# Patient Record
Sex: Female | Born: 1975 | Race: White | Hispanic: No | Marital: Married | Smoking: Never smoker
Health system: Southern US, Community
[De-identification: ages and names within clinical notes are randomized; demographics above are authoritative.]

## PROBLEM LIST (undated history)

## (undated) DIAGNOSIS — E669 Obesity, unspecified: Secondary | ICD-10-CM

## (undated) DIAGNOSIS — M1990 Unspecified osteoarthritis, unspecified site: Secondary | ICD-10-CM

## (undated) DIAGNOSIS — K219 Gastro-esophageal reflux disease without esophagitis: Secondary | ICD-10-CM

## (undated) HISTORY — DX: Gastro-esophageal reflux disease without esophagitis: K21.9

## (undated) HISTORY — DX: Obesity, unspecified: E66.9

## (undated) HISTORY — PX: CHOLECYSTECTOMY: SHX55

---

## 1998-02-10 ENCOUNTER — Inpatient Hospital Stay (HOSPITAL_COMMUNITY): Admission: AD | Admit: 1998-02-10 | Discharge: 1998-02-10 | Payer: Self-pay | Admitting: Obstetrics and Gynecology

## 1998-03-12 ENCOUNTER — Inpatient Hospital Stay (HOSPITAL_COMMUNITY): Admission: AD | Admit: 1998-03-12 | Discharge: 1998-03-12 | Payer: Self-pay | Admitting: *Deleted

## 1998-03-16 ENCOUNTER — Inpatient Hospital Stay (HOSPITAL_COMMUNITY): Admission: AD | Admit: 1998-03-16 | Discharge: 1998-03-19 | Payer: Self-pay | Admitting: Obstetrics and Gynecology

## 1998-03-19 ENCOUNTER — Encounter (HOSPITAL_COMMUNITY): Admission: RE | Admit: 1998-03-19 | Discharge: 1998-06-17 | Payer: Self-pay | Admitting: Obstetrics and Gynecology

## 1998-04-28 ENCOUNTER — Other Ambulatory Visit: Admission: RE | Admit: 1998-04-28 | Discharge: 1998-04-28 | Payer: Self-pay | Admitting: Obstetrics & Gynecology

## 1998-05-17 ENCOUNTER — Other Ambulatory Visit: Admission: RE | Admit: 1998-05-17 | Discharge: 1998-05-17 | Payer: Self-pay | Admitting: Obstetrics & Gynecology

## 1998-07-14 ENCOUNTER — Other Ambulatory Visit: Admission: RE | Admit: 1998-07-14 | Discharge: 1998-07-14 | Payer: Self-pay | Admitting: Obstetrics & Gynecology

## 1998-10-18 ENCOUNTER — Other Ambulatory Visit: Admission: RE | Admit: 1998-10-18 | Discharge: 1998-10-18 | Payer: Self-pay | Admitting: Obstetrics & Gynecology

## 2000-08-21 ENCOUNTER — Other Ambulatory Visit: Admission: RE | Admit: 2000-08-21 | Discharge: 2000-08-21 | Payer: Self-pay | Admitting: Obstetrics and Gynecology

## 2000-10-10 ENCOUNTER — Encounter: Payer: Self-pay | Admitting: Obstetrics and Gynecology

## 2000-10-10 ENCOUNTER — Ambulatory Visit (HOSPITAL_COMMUNITY): Admission: RE | Admit: 2000-10-10 | Discharge: 2000-10-10 | Payer: Self-pay | Admitting: Obstetrics and Gynecology

## 2000-10-12 ENCOUNTER — Inpatient Hospital Stay (HOSPITAL_COMMUNITY): Admission: AD | Admit: 2000-10-12 | Discharge: 2000-10-12 | Payer: Self-pay | Admitting: Obstetrics and Gynecology

## 2000-10-29 ENCOUNTER — Ambulatory Visit (HOSPITAL_COMMUNITY): Admission: RE | Admit: 2000-10-29 | Discharge: 2000-10-29 | Payer: Self-pay | Admitting: Obstetrics and Gynecology

## 2000-10-29 ENCOUNTER — Encounter: Payer: Self-pay | Admitting: Obstetrics and Gynecology

## 2000-11-07 ENCOUNTER — Encounter: Payer: Self-pay | Admitting: Obstetrics and Gynecology

## 2000-11-07 ENCOUNTER — Ambulatory Visit (HOSPITAL_COMMUNITY): Admission: RE | Admit: 2000-11-07 | Discharge: 2000-11-07 | Payer: Self-pay | Admitting: Obstetrics and Gynecology

## 2000-12-10 ENCOUNTER — Ambulatory Visit (HOSPITAL_COMMUNITY): Admission: RE | Admit: 2000-12-10 | Discharge: 2000-12-10 | Payer: Self-pay | Admitting: Obstetrics and Gynecology

## 2000-12-14 ENCOUNTER — Ambulatory Visit (HOSPITAL_COMMUNITY): Admission: RE | Admit: 2000-12-14 | Discharge: 2000-12-14 | Payer: Self-pay | Admitting: Obstetrics and Gynecology

## 2001-01-01 ENCOUNTER — Encounter: Payer: Self-pay | Admitting: Obstetrics and Gynecology

## 2001-01-01 ENCOUNTER — Encounter: Admission: RE | Admit: 2001-01-01 | Discharge: 2001-04-01 | Payer: Self-pay | Admitting: Obstetrics and Gynecology

## 2001-01-01 ENCOUNTER — Ambulatory Visit (HOSPITAL_COMMUNITY): Admission: RE | Admit: 2001-01-01 | Discharge: 2001-01-01 | Payer: Self-pay | Admitting: Obstetrics and Gynecology

## 2001-01-21 ENCOUNTER — Inpatient Hospital Stay (HOSPITAL_COMMUNITY): Admission: AD | Admit: 2001-01-21 | Discharge: 2001-01-21 | Payer: Self-pay | Admitting: Obstetrics and Gynecology

## 2001-02-08 ENCOUNTER — Encounter: Payer: Self-pay | Admitting: Obstetrics and Gynecology

## 2001-02-08 ENCOUNTER — Ambulatory Visit (HOSPITAL_COMMUNITY): Admission: RE | Admit: 2001-02-08 | Discharge: 2001-02-08 | Payer: Self-pay | Admitting: Obstetrics and Gynecology

## 2001-02-13 ENCOUNTER — Inpatient Hospital Stay (HOSPITAL_COMMUNITY): Admission: AD | Admit: 2001-02-13 | Discharge: 2001-02-13 | Payer: Self-pay | Admitting: Obstetrics and Gynecology

## 2001-02-19 ENCOUNTER — Encounter (HOSPITAL_COMMUNITY): Admission: RE | Admit: 2001-02-19 | Discharge: 2001-02-23 | Payer: Self-pay | Admitting: Obstetrics and Gynecology

## 2001-02-23 ENCOUNTER — Inpatient Hospital Stay (HOSPITAL_COMMUNITY): Admission: AD | Admit: 2001-02-23 | Discharge: 2001-02-26 | Payer: Self-pay | Admitting: Obstetrics and Gynecology

## 2001-10-23 ENCOUNTER — Emergency Department (HOSPITAL_COMMUNITY): Admission: EM | Admit: 2001-10-23 | Discharge: 2001-10-23 | Payer: Self-pay | Admitting: *Deleted

## 2005-04-02 ENCOUNTER — Emergency Department (HOSPITAL_COMMUNITY): Admission: EM | Admit: 2005-04-02 | Discharge: 2005-04-02 | Payer: Self-pay | Admitting: Emergency Medicine

## 2005-04-03 ENCOUNTER — Ambulatory Visit (HOSPITAL_COMMUNITY): Admission: RE | Admit: 2005-04-03 | Discharge: 2005-04-03 | Payer: Self-pay | Admitting: Emergency Medicine

## 2005-04-05 ENCOUNTER — Ambulatory Visit (HOSPITAL_COMMUNITY): Admission: RE | Admit: 2005-04-05 | Discharge: 2005-04-05 | Payer: Self-pay | Admitting: General Surgery

## 2008-12-10 ENCOUNTER — Ambulatory Visit: Payer: Self-pay | Admitting: Internal Medicine

## 2009-01-06 ENCOUNTER — Ambulatory Visit (HOSPITAL_COMMUNITY): Admission: RE | Admit: 2009-01-06 | Discharge: 2009-01-06 | Payer: Self-pay | Admitting: Internal Medicine

## 2009-01-06 ENCOUNTER — Ambulatory Visit: Payer: Self-pay | Admitting: Internal Medicine

## 2010-07-15 ENCOUNTER — Emergency Department (HOSPITAL_COMMUNITY): Admission: EM | Admit: 2010-07-15 | Discharge: 2010-07-15 | Payer: Self-pay | Admitting: Emergency Medicine

## 2011-05-09 NOTE — H&P (Signed)
NAME:  Latoya Espinoza, Latoya Espinoza         ACCOUNT NO.:  [id number]   MEDICAL RECORD NO.:  [id number]          PATIENT TYPE:  AMB   LOCATION:  DAY                           FACILITY:  [facility]   PHYSICIAN:  R. Roetta Sessions, M.D. DATE OF BIRTH:  07/25/1976   DATE OF ADMISSION:  DATE OF DISCHARGE:  LH                              HISTORY & PHYSICAL   REFERRING PHYSICIAN:  Dr. Wyvonnia Lora at [facility].   REASON FOR CONSULTATION:  Rectal bleeding.   HISTORY OF PRESENT ILLNESS:  Latoya Espinoza is a pleasant 35-  year-old lady referred over at the courtesy of Dr. Margo Common for evaluation  of greater than 1 year history of intermittent rectal bleeding, which  has worsened recently.  Latoya Espinoza tells me that she has noted a  little bit of blood on a regular basis, when she wipes and sometimes on  top of the stool and in the toilet water, otherwise, in the setting of  normal bowel habits, which she describes as 1 formed-brown stool daily.  She denies ever having strained.  She currently denies any constipation  or diarrhea.  She did have problems with diarrhea years ago (see below).  Over the past month, she has had a little bit more rectal bleeding and  has had some vague anorectal discomfort.  She has really has not had any  abdominal pain.  No upper GI tract symptoms such as odynophagia,  dysphagia, or early sign of reflux symptoms, nausea, or vomiting.  There  has been no fever or chills.  I saw this nice lady back in 2001 for  postprandial abdominal cramps and nonbloody diarrhea.  She underwent a  sigmoidoscopy over at [facility] for these symptoms.  This  revealed a normal rectum and colon of 60 cm and was felt she had  irritable bowel syndrome.  She was treated with Levsin, those symptoms  subsided.  There is no family history of first degree relatives with  colon cancer or polyps.  However, she does have maternal aunt and  grandfather with polyps and colon cancer respectively at  advanced age.   PAST MEDICAL HISTORY:  Unremarkable chronic illnesses.   PAST SURGERIES:  None.   CURRENT MEDICATIONS:  Multivitamin supplement daily.   ALLERGIES:  No known drug allergies.   FAMILY HISTORY:  As outlined above.  Father succumbed with MI at age 20.  Mother is alive in good health.   SOCIAL HISTORY:  The patient is married.  She has 2 children.  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  No tobacco.  No alcohol.  No  illicit drugs.   REVIEW OF SYSTEMS:  No odynophagia, dysphagia, early satiety, nausea, or  vomiting.  No change in weight.  No night sweats, fever, or chills.   PHYSICAL EXAMINATION:  GENERAL:  Pleasant, obese, 35 year old lady  resting comfortably.  VITAL SIGNS:  Weight 252, height 5 feet 5 inches, temperature 98.1, BP  102/60, pulse 60.  SKIN:  Warm and dry.  There is no jaundice.  HEENT:  No  scleral icterus.  Conjunctivae pink.  CHEST:  Lungs are clear to auscultation.  CARDIAC:  Regular rate and rhythm without murmur, gallop, or rub.  ABDOMEN:  Obese; positive bowel sounds; soft and nontender without  appreciable mass or organomegaly.  RECTAL:  Declined by the patient, stating she is on her menses.   IMPRESSION:  Latoya Espinoza is a very pleasant 35 year old lady with a  greater 1-year history of rectal bleeding which has worsened somewhat  over the past 1 month and there has been some anorectal discomfort with  having a bowel movement.  I am somewhat concerned about the long-  standing nature of her symptoms with recent worsening, hopefully, this  is related to a benign anorectal process but I told Mr. Lennartz,  she certainly needs to have her lower GI tract evaluated and we need to  plan to do a diagnostic colonoscopy.  Risks, benefits, alternatives, and  limitations of this approach have been reviewed.  Questions answered.  She is agreeable to proceed.  We  will set up a diagnostic colonoscopy in  the very near future at [facility] and make further  recommendations at that time.   I would like to thank Dr. Wyvonnia Lora for allowing me to see this nice  lady once again.      Jonathon Bellows, M.D.  Electronically Signed     RMR/MEDQ  D:  12/10/2008  T:  12/10/2008  Job:  [id number]   cc:   Wyvonnia Lora  Fax: [phone]

## 2011-05-09 NOTE — Op Note (Signed)
NAME:  LATIYA, NAVIA         ACCOUNT NO.:  [id number]   MEDICAL RECORD NO.:  [id number]          PATIENT TYPE:  AMB   LOCATION:  DAY                           FACILITY:  [facility]   PHYSICIAN:  R. Roetta Sessions, M.D. DATE OF BIRTH:  September 06, 1976   DATE OF PROCEDURE:  01/06/2009  DATE OF DISCHARGE:                               OPERATIVE REPORT   INDICATIONS FOR PROCEDURE:  A 32-year lady with a 1-year history of  intermittent rectal bleeding somewhat worsening over the past 1 month  prior to seeing me on December 10, 2008.  She had some vague anorectal  discomfort with having a bowel movement.  Otherwise recently, she has  not had any alteration of bowel function including diarrhea or  constipation.  Colonoscopy is now being done.  Risks, benefits,  alternatives, and limitations have been reviewed, questions answered.  She is agreeable.  Please see the documentation in the medical record.   PROCEDURE NOTE:  O2 saturation, blood pressure, pulse, and respiration  were monitored throughout the entire procedure.   CONSCIOUS SEDATION:  Versed 4 mg IV, Demerol 75 mg IV in divided doses.   INSTRUMENT:  Pentax video chip system.   FINDINGS:  Digital rectal exam revealed no abnormalities.  Endoscopic  Findings:  Prep was excellent.  Colon:  Colonic mucosa was surveyed from  the rectosigmoid junction through the left transverse, right colon to  the appendiceal orifice, ileocecal valve, and cecum.  Structures were  well seen and photographed for the record.  Terminal ileum was intubated  to 10 cm.  From this level, scope was slowly and cautiously withdrawn.  All previously mentioned mucosal surfaces were again seen.  The colonic  mucosa appeared as did the terminal ileal mucosa appeared entirely  normal.  Scope was pulled down the rectum.  Thorough examination of the  rectal mucosa including retroflexed view of the anal verge and en face  view of the anal canal demonstrated some minimally  friable anal canal  tissue only.  The patient tolerated the procedure well and was reacted  in endoscopy.   IMPRESSION:  Friable anal canal otherwise normal rectum, colon, terminal  ileum.  I suspect benign anorectal bleeding.   RECOMMENDATIONS:  1. Attending course of Anusol-HC Suppository one per rectum at      bedtime.  2. Benefiber 1 tablespoon daily.  3. Ms. Yousuf is to let me know if rectal bleeding does not      resolve.  4. We would plan for routine colon cancer screening beginning at age      13.      Jonathon Bellows, M.D.  Electronically Signed     RMR/MEDQ  D:  01/06/2009  T:  01/06/2009  Job:  [id number]   cc:   Wyvonnia Lora  Fax: [phone]

## 2011-05-12 NOTE — Discharge Summary (Signed)
[facility]  Patient:    Latoya Espinoza, Latoya Espinoza                MRN: [id number] Adm. Date:  [id number] Disc. Date: [id number] Attending:  Michaele Offer                           Discharge Summary  ADMISSION DIAGNOSES:           Intrauterine pregnancy at 38 weeks, class A 2 gestational diabetes.  DISCHARGE DIAGNOSES:          Intrauterine pregnancy at 38 weeks, class A 2 gestational diabetes, shoulder dystocia.  PROCEDURE:                    Spontaneous vaginal delivery.  COMPLICATIONS:                Shoulder dystocia.  CONSULTATIONS:                None.  HISTORY AND PHYSICAL:         This is a 35 year old white female gravida 3, para 1-0-1-1 with an EGA of 38+ weeks by an LMP consistent with a 9 week ultrasound with an EDC of March 15 who presents with a complaint of ruptured membranes followed by contractions with good fetal movement and no vaginal bleeding.  Prenatal care complicated by class A 2 gestational diabetes which was well controlled with insulin and size greater than dates with an estimated fetal weight of 3500 g on February 15.  PRENATAL LABORATORIES:        Blood type B- with a negative antibody screen. RPR nonreactive.  Rubella immune.  Hepatitis B surface antigen negative.  HIV negative.  Gonorrhea and chlamydia negative.  Triple screen normal.  One hour glucola 206.  Fasting blood sugar 136.  Group B strep is negative.  PAST OBSTETRICAL HISTORY:     In 1996 spontaneous abortion.  In 1999 vaginal delivery at 35 weeks 6 pounds 4 ounces and the baby did have some respiratory distress.  PAST GYNECOLOGICAL HISTORY:   Patient had a LEEP in 1999 with normal followup Pap smears.  MEDICATIONS:                  Insulin 20 units of NPH q.h.s.  PHYSICAL EXAMINATION  VITAL SIGNS:                  She is afebrile with stable vital signs.  CBG 71.  Fetal heart tracing is reactive with regular contractions.  ABDOMEN:                       Gravid, nontender with a fundal height of 41 cm and an estimated fetal weight of 8+ pounds.  PELVIC:                       Vaginal examination on admission is 7, 90 and -1/0 station with a vertex presentation.  HOSPITAL COURSE:              Patient was admitted and continued to progress on her own without pain medications.  She progressed to complete and began pushing and was very uncomfortable.  She was given a pudendal block without much relief and local block in the perineum.  Early in the morning of March 3 she did bring the vertex to the perineum and delivered the vertex  which then turtled back against the perineum.  There was a three to four minute shoulder dystocia which resolved with McRoberts maneuver, suprapubic pressure, and wood screw, and a second degree episiotomy.  This was a viable female infant with Apgars of 4 and 8 that weighed 8 pounds 10 ounces and was delivered in the ROA position.  The NICU team was called in and was present immediately after delivery.  The baby did appear to move all extremities well.  The placenta delivered spontaneously and was intact.  There was a third degree extension of the episiotomy repaired with 2-0 Vicryl and her second degree episiotomy was repaired with 3-0 Vicryl.  Estimated blood loss was less than 500 cc.  Postpartum she did very well, bottle fed her baby without complications.  She had initially signed the consent to have her tubes tied, but decided against this for now.  The morning of postpartum day #2 she was felt to be stable enough for discharge home.  CONDITION ON DISCHARGE:       Stable.  DISPOSITION:                  Discharged to home.  DIET:                         Regular.  ACTIVITY:                     Pelvic rest.  FOLLOW-UP:                    Four to six weeks.  MEDICATIONS:                  Percocet p.r.n. pain, Colace b.i.d. over-the-counter.  DISCHARGE INSTRUCTIONS:       She is also given our  discharge pamphlet. DD:  02/26/01 TD:  02/26/01 Job: [id number]

## 2011-05-12 NOTE — H&P (Signed)
NAMEMarland Kitchen  Latoya Espinoza, Latoya Espinoza NO.:  [id number]   MEDICAL RECORD NO.:  [id number]          PATIENT TYPE:  OUT   LOCATION:  RAD                           FACILITY:  [facility]   PHYSICIAN:  Dalia Heading, M.D.  DATE OF BIRTH:  [birth year]   DATE OF ADMISSION:  04/03/2005  DATE OF DISCHARGE:  LH                                HISTORY & PHYSICAL   CHIEF COMPLAINT:  Cholecystitis, cholelithiasis.   HISTORY OF PRESENT ILLNESS:  The patient is a 35 year old white female who  was referred for evaluation and treatment of cholecystitis secondary to  cholelithiasis.  She has been having right upper quadrant abdominal pain  with radiation to the right flank, nausea and bloating for several days.  She does have fatty food intolerance.  No fever, chills or jaundice have  been noted.   PAST MEDICAL HISTORY:  Unremarkable.   PAST SURGICAL HISTORY:  Unremarkable.   CURRENT MEDICATIONS:  None.   ALLERGIES:  No known drug allergies.   REVIEW OF SYSTEMS:  Noncontributory.   PHYSICAL EXAMINATION:  GENERAL APPEARANCE:  The patient is a well-developed,  well-nourished white female in no acute distress.  VITAL SIGNS:  She is afebrile, and the vital signs are stable.  HEENT:  Examination reveals no scleral icterus.  LUNGS:  Clear to auscultation with equal breath sounds bilaterally.  HEART:  Examination reveals a regular rate and rhythm without S3, S4 or  murmurs.  ABDOMEN:  Soft and nondistended.  She is tender in the right upper quadrant  to palpation.  No hepatosplenomegaly, masses or hernias are identified.   An ultrasound of the gallbladder reveals cholecystitis with normal common  bile duct and cholelithiasis.   IMPRESSION:  1.  Cholecystitis.  2.  Cholelithiasis.   PLAN:  The patient is scheduled for a laparoscopic cholecystectomy on April 05, 2005.  The risks and benefits of the procedure including bleeding,  infection, hepatobiliary injury and possible even open procedure  were fully  explained to the patient, who gave informed consent.      MAJ/MEDQ  D:  04/04/2005  T:  04/04/2005  Job:  [id number]   cc:   Dalia Heading, M.D.  [address], [place]  [place]  [place] [address]  Fax: [phone]   Short Stay at [facility]  [address]  [place]  [place] [address]  Fax: [phone]

## 2011-05-12 NOTE — Op Note (Signed)
NAME:  Latoya Espinoza, Latoya Espinoza         ACCOUNT NO.:  [id number]   MEDICAL RECORD NO.:  [id number]          PATIENT TYPE:  AMB   LOCATION:  DAY                           FACILITY:  [facility]   PHYSICIAN:  Dalia Heading, M.D.  DATE OF BIRTH:  February 08, 1976   DATE OF PROCEDURE:  04/05/2005  DATE OF DISCHARGE:                                 OPERATIVE REPORT   PREOPERATIVE DIAGNOSIS:  Cholecystitis, cholelithiasis.   POSTOPERATIVE DIAGNOSIS:  Cholecystitis, cholelithiasis.   PROCEDURE:  Laparoscopic cholecystectomy.   SURGEON:  Dr. Franky Macho.   ASSISTANT:  Bernerd Limbo. Leona Carry, M.D.   ANESTHESIA:  General endotracheal.   INDICATIONS:  The patient is a 35 year old white female who is being treated  for cholecystitis secondary to cholelithiasis.  The risks and benefits of  the procedure including bleeding, infection, hepatobiliary injury, and the  possibly of an open procedure were fully explained to the patient, who gave  informed consent.   PROCEDURE NOTE:  The patient was placed in supine position. After induction  of general endotracheal anesthesia, the abdomen was prepped and draped in  the usual sterile technique with Betadine. Surgical site confirmation was  performed.   A supraumbilical incision was made down to fascia. A Veress needle was  introduced into the abdominal cavity and confirmation of placement was done  using the saline drop test. The abdomen was then insufflated to 16 mmHg  pressure. An 11 mm trocar was introduced into the abdominal cavity under  direct visualization without difficulty. The patient was placed in reversed  Trendelenburg position. An additional 11 mm trocar was placed in the  epigastric region and 5 mm trocars placed in the right upper quadrant right  flank regions.  The liver was inspected and noted to normal limits. The  gallbladder was retracted superior and laterally.  The dissection was begun  around the infundibulum of the gallbladder. The cystic  duct was first  identified.  The juncture to the infundibulum fully identified.  Endoclips  placed proximally and distally on the cystic duct and cystic duct was  divided. This was likewise done was done with the cystic artery. The  gallbladder was then freed away from the gallbladder fossa using Bovie  electrocautery. The gallbladder delivered through the epigastric trocar site  using EndoCatch bag. The gallbladder fossa was inspected. No abnormal  bleeding or bile leakage was noted. Surgicel was placed in the gallbladder  fossa. All fluid and air were then evacuated from the abdominal cavity prior  to removal of the trocars.   All wounds were irrigated with normal saline. All wounds were checked with  0.5 cm Sensorcaine. The supraumbilical fascia was reapproximated using a 0  Vicryl interrupted suture. All skin incisions were closed using staples.  Betadine ointment and dry sterile dressings were applied.   All tape and needle counts were correct at the end of the procedure. The  patient was extubated in the operating room and went back to recovery room  awake in stable condition.   COMPLICATIONS:  None.   SPECIMEN:  Gallbladder with stones.   BLOOD LOSS:  Minimal.  MAJ/MEDQ  D:  04/05/2005  T:  04/05/2005  Job:  [id number]   cc:   Wyvonnia Lora  [address]  [place]  [place] [address]  Fax: [phone]

## 2013-08-20 ENCOUNTER — Encounter: Payer: Self-pay | Admitting: Advanced Practice Midwife

## 2015-10-20 ENCOUNTER — Ambulatory Visit (HOSPITAL_COMMUNITY): Payer: BLUE CROSS/BLUE SHIELD | Attending: Anesthesiology | Admitting: Physical Therapy

## 2015-10-20 DIAGNOSIS — R29898 Other symptoms and signs involving the musculoskeletal system: Secondary | ICD-10-CM | POA: Diagnosis present

## 2015-10-20 DIAGNOSIS — M2560 Stiffness of unspecified joint, not elsewhere classified: Secondary | ICD-10-CM

## 2015-10-20 DIAGNOSIS — R293 Abnormal posture: Secondary | ICD-10-CM | POA: Diagnosis present

## 2015-10-20 DIAGNOSIS — M545 Low back pain: Secondary | ICD-10-CM | POA: Insufficient documentation

## 2015-10-20 DIAGNOSIS — R198 Other specified symptoms and signs involving the digestive system and abdomen: Secondary | ICD-10-CM | POA: Diagnosis present

## 2015-10-20 NOTE — Patient Instructions (Signed)
Transverse Abdominus Draw-In  Lay in a supine hooklying position as shown.  Slowly draw in the lower abdomen below the navel as if tightening a belt.   POSTERIOR PELVIC TILT  Lie on your back on a firm surface with knees comfortably bent (top picture).  Then flatten back against the table while contracting abdominal muscles as if pulling belly button toward ribs (bottom picture).      BRIDGING  While lying on your back, tighten your lower abdominals, squeeze your buttocks and then raise your buttocks off the floor/bed as creating a "Bridge" with your body.   McKenzie Seated Flexion  Begin by sitting on the edge of a steady chair with your knees and feet well apart. Bend your trunk forward and try to reach in between your legs as shown. Return immediately to the starting position. Try to bend down a little further each time you try this exercise.

## 2015-10-20 NOTE — Therapy (Signed)
[facility] [address] [place], [place], [address] Phone: [phone]   Fax:  [phone]  Physical Therapy Evaluation  Patient Details  Name: Latoya Espinoza MRN: [id number] Date of Birth: 23-Apr-1976 Referring Provider: Dr. Laurian Brim  Encounter Date: 10/20/2015      PT End of Session - 10/20/15 1628    Visit Number 1   Number of Visits 12   Date for PT Re-Evaluation 11/19/15   Authorization Time Period 10/20/15-12/20/15   PT Start Time 1530   PT Stop Time 1614   PT Time Calculation (min) 44 min   Activity Tolerance Patient tolerated treatment well   Behavior During Therapy [facility] for tasks assessed/performed      No past medical history on file.  No past surgical history on file.  There were no vitals filed for this visit.  Visit Diagnosis:  Bilateral low back pain, unspecified chronicity, with sciatica presence unspecified  Abdominal weakness  Poor posture  Weakness of both legs  Joint stiffness of spine      Subjective Assessment - 10/20/15 1534    Subjective Pt reports that she has been having low back pain that has gotten progressively worse over the past 6 months. She has difficulty sitting for long periods of time, doing heavier housework, doing a lot of activity, and bending and twisting. Pt works at a desk all day, and she has a lot of difficulty making it through the day without pain. Her pain is also affecting her sleep, and she is now getting up 2-3 times a night to change positions due to pain. She reports that she is getting an injection on Friday to try to decrease her pain.    How long can you sit comfortably? 30 minutes   How long can you stand comfortably? Up to an hour if moving positions   How long can you walk comfortably? 45 minutes   Patient Stated Goals Increase activity level, decrease pain, be able to walk more   Currently in Pain? Yes   Pain Score 2   has been 8-9 in the past few days    Pain Location Back   Pain Relieving Factors Sidelying            [facility] PT Assessment - 10/20/15 0001    Assessment   Medical Diagnosis LBP   Referring Provider Dr. Laurian Brim   Next MD Visit 10/22/15   Prior Therapy no   Balance Screen   Has the patient fallen in the past 6 months No   Has the patient had a decrease in activity level because of a fear of falling?  No   Is the patient reluctant to leave their home because of a fear of falling?  No   Home Environment   Living Environment Private residence   Living Arrangements Spouse/significant other;Children   Type of Home House   Home Access Stairs to enter   Entrance Stairs-Number of Steps 1   Home Layout One level   Prior Function   Level of Independence Independent   Vocation Full time employment   Vocation Requirements Works at desk for full 8 hours   Leisure Enjoys walking her dog, enjoys exercise, doing yardwork   Observation/Other Assessments   Focus on Therapeutic Outcomes (FOTO)  56%   Posture/Postural Control   Posture/Postural Control Postural limitations   Postural Limitations Increased lumbar lordosis;Anterior pelvic tilt   ROM / Strength   AROM / PROM / Strength AROM;Strength  AROM   AROM Assessment Site Lumbar;Hip   Right/Left Hip Right;Left   Right Hip External Rotation  36   Right Hip Internal Rotation  35   Left Hip External Rotation  46   Left Hip Internal Rotation  37   Lumbar Flexion 60  hinge at L2/3   Lumbar Extension 17  hinge at L2/3   Lumbar - Right Side Bend 20  hinge at L2/3   Lumbar - Left Side Bend 22  hinge at L2/3   Strength   Overall Strength Comments ab strength 2/5   Strength Assessment Site Hip;Knee   Right/Left Hip Right;Left   Right Hip Flexion 4-/5   Right Hip Extension 4-/5   Right Hip ABduction 4-/5   Left Hip Flexion 4-/5   Left Hip Extension 3+/5   Left Hip External Rotation 4/5   Right/Left Knee Right;Left   Right Knee Flexion 4/5   Right Knee Extension 4-/5    Left Knee Flexion 4/5   Left Knee Extension 5/5   Palpation   Spinal mobility hypermobile L2/3, hypomobile L3/4-L5/S1   Palpation comment increased muscle guarding present in lumbar paraspinals, R sacral sulcus, R SI joint   Special Tests    Special Tests Lumbar;Sacrolliac Tests   Lumbar Tests FABER test;Straight Leg Raise   FABER test   findings Negative   Straight Leg Raise   Findings Positive   Side  Right   Comment 47 degrees   Transfers   Five time sit to stand comments  17.08"                PT Education - 10/20/15 1628    Education provided Yes   Education Details HEP, prognosis, importance of proper posture   Person(s) Educated Patient   Methods Explanation;Handout   Comprehension Verbalized understanding          PT Short Term Goals - 10/20/15 1633    PT SHORT TERM GOAL #1   Title Pt will be independent with HEP.   Time 3   Period Weeks   Status New   PT SHORT TERM GOAL #2   Title Increase lumbar flexion to 70 degrees or greater to allow pt to bend forward without pain.    Time 3   Period Weeks   Status New   PT SHORT TERM GOAL #3   Title Improve BLE strength as evidenced by five time sit to stand time of 13 seconds or less.    Time 3   Period Weeks   Status New   PT SHORT TERM GOAL #4   Title Pt will report being able to sit for 2 hours at work without increased back pain.    Time 3   Period Weeks   Status New           PT Long Term Goals - 10/20/15 1635    PT LONG TERM GOAL #1   Title Pt will be independent with advanced HEP for core stabilization.   Time 6   Period Weeks   Status New   PT LONG TERM GOAL #2   Title Improve lumbar ROM to be full and painfree to allow for pt to bend and rotate to complete functional tasks.    Time 6   Period Weeks   Status New   PT LONG TERM GOAL #3   Title Improve BLE strength to 4+/5, with pt being able to complete five time sit to stand in 11 seconds or less.  Time 6   Period Weeks   Status  New   PT LONG TERM GOAL #4   Title Pt will report working for 6 hours before onset of LBP to demonstrate improved functional activity tolerance.    Time 6   Period Weeks   Status New   PT LONG TERM GOAL #5   Title Pt will report being able to walk for a full hour on her lunch break without onset of LBP.    Time 6   Period Weeks   Status New               Plan - 10/20/15 1629    Clinical Impression Statement Pt presents to PT with c/o LBP symptoms that have been worsening for the past 6 months. Upon examination, pt demonstrates decreased spinal mobility and ROM, with a hinge present at L2/3 level that is affecting her ROM. Pt also demonstrates weakness of abdominals, BLE weakness, increased muscle guarding in lumbar paraspinals, sacral sulcus, SI joint, and piriformis. Pt will benefit from skilled physical therapy services at this time to address these impairments in order to retore normal motion of spine, improve core strength and posture, improve BLE strength, and decrease LBP to return pt to PLOF.    Pt will benefit from skilled therapeutic intervention in order to improve on the following deficits Decreased activity tolerance;Decreased mobility;Decreased range of motion;Decreased strength;Hypermobility;Hypomobility;Impaired flexibility;Postural dysfunction;Pain   Rehab Potential Good   Clinical Impairments Affecting Rehab Potential Pt demonstrates increased lordosis due to obesity   PT Frequency 2x / week   PT Duration 6 weeks   PT Treatment/Interventions ADLs/Self Care Home Management;Moist Heat;Traction;Gait training;Stair training;Functional mobility training;Therapeutic activities;Therapeutic exercise;Neuromuscular re-education;Patient/family education;Manual techniques   PT Next Visit Plan Review HEP, continue with core stabilization, manual to address impaired mobility in L2/3-L5/S1         Problem List There are no active problems to display for this  patient.   Leona SingletonLauren Raife Lizer, PT, DPT [phone] 10/20/2015, 4:39 PM  [facility] [address] [place], [place], [address] Phone: [phone]   Fax:  [phone]  Name: Latoya Espinoza MRN: [id number] Date of Birth: 12/02/1976

## 2015-10-26 ENCOUNTER — Ambulatory Visit (HOSPITAL_COMMUNITY): Payer: BLUE CROSS/BLUE SHIELD | Attending: Anesthesiology | Admitting: Physical Therapy

## 2015-10-26 DIAGNOSIS — M545 Low back pain: Secondary | ICD-10-CM | POA: Insufficient documentation

## 2015-10-26 DIAGNOSIS — R29898 Other symptoms and signs involving the musculoskeletal system: Secondary | ICD-10-CM | POA: Diagnosis present

## 2015-10-26 DIAGNOSIS — R293 Abnormal posture: Secondary | ICD-10-CM

## 2015-10-26 DIAGNOSIS — M2560 Stiffness of unspecified joint, not elsewhere classified: Secondary | ICD-10-CM | POA: Insufficient documentation

## 2015-10-26 DIAGNOSIS — R198 Other specified symptoms and signs involving the digestive system and abdomen: Secondary | ICD-10-CM | POA: Insufficient documentation

## 2015-10-26 NOTE — Therapy (Signed)
[facility] [address] [place], [place], [address] Phone: [phone]   Fax:  [phone]  Physical Therapy Treatment  Patient Details  Name: Latoya Bleacherracey W Shiraishi MRN: [id number] Date of Birth: 08/07/1976 Referring Provider: Dr. Laurian Brim'Toole  Encounter Date: 10/26/2015      PT End of Session - 10/26/15 1748    Visit Number 2   Number of Visits 12   Date for PT Re-Evaluation 11/19/15   Authorization Type BCBS   Authorization Time Period 10/20/15-12/20/15   PT Start Time 1645   PT Stop Time 1734   PT Time Calculation (min) 49 min   Activity Tolerance Patient tolerated treatment well   Behavior [facility] Therapy [facility] for tasks assessed/performed      No past medical history on file.  No past surgical history on file.  There were no vitals filed for this visit.  Visit Diagnosis:  Bilateral low back pain, unspecified chronicity, with sciatica presence unspecified  Abdominal weakness  Poor posture  Weakness of both legs      Subjective Assessment - 10/26/15 1649    Subjective Pt reports that she got injections in her lower back on Friday, and when she woke up on Saturday, she had about 80% relief of pain. She has been sleeping a little better since the injections, but the 2 nights the tenderness in her back has been coming back.    Currently in Pain? Yes   Pain Score 2    Pain Location Back   Pain Orientation Lower                 [facility] Adult PT Treatment/Exercise - 10/26/15 0001    Exercises   Exercises Lumbar   Lumbar Exercises: Stretches   Single Knee to Chest Stretch 10 seconds;5 reps   Pelvic Tilt Limitations 15 reps, 3 second hold   Prone on Elbows Stretch 2 reps;30 seconds   Lumbar Exercises: Standing   Shoulder Extension 10 reps   Theraband Level (Shoulder Extension) Level 2 (Red)   Other Standing Lumbar Exercises 3D hip excursions   Other Standing Lumbar Exercises standing anterior/posterior pelvic tilts  against door with tactile and visual cueing   Lumbar Exercises: Seated   Other Seated Lumbar Exercises seated march with ab brace x 10 bilat   Lumbar Exercises: Supine   Ab Set 15 reps;3 seconds   Bent Knee Raise 10 reps   Bridge 15 reps   Other Supine Lumbar Exercises bent knee fall out with ab set x 10   Lumbar Exercises: Sidelying   Hip Abduction 10 reps   Lumbar Exercises: Prone   Straight Leg Raise 10 reps   Manual Therapy   Manual Therapy Soft tissue mobilization   Soft tissue mobilization STM to bilateral lumbar paraspinals and QL, focus on R>L side                  PT Short Term Goals - 10/20/15 1633    PT SHORT TERM GOAL #1   Title Pt will be independent with HEP.   Time 3   Period Weeks   Status New   PT SHORT TERM GOAL #2   Title Increase lumbar flexion to 70 degrees or greater to allow pt to bend forward without pain.    Time 3   Period Weeks   Status New   PT SHORT TERM GOAL #3   Title Improve BLE strength as evidenced by five time sit to stand time of 13  seconds or less.    Time 3   Period Weeks   Status New   PT SHORT TERM GOAL #4   Title Pt will report being able to sit for 2 hours at work without increased back pain.    Time 3   Period Weeks   Status New           PT Long Term Goals - 10/20/15 1635    PT LONG TERM GOAL #1   Title Pt will be independent with advanced HEP for core stabilization.   Time 6   Period Weeks   Status New   PT LONG TERM GOAL #2   Title Improve lumbar ROM to be full and painfree to allow for pt to bend and rotate to complete functional tasks.    Time 6   Period Weeks   Status New   PT LONG TERM GOAL #3   Title Improve BLE strength to 4+/5, with pt being able to complete five time sit to stand in 11 seconds or less.    Time 6   Period Weeks   Status New   PT LONG TERM GOAL #4   Title Pt will report working for 6 hours before onset of LBP to demonstrate improved functional activity tolerance.    Time 6    Period Weeks   Status New   PT LONG TERM GOAL #5   Title Pt will report being able to walk for a full hour on her lunch break without onset of LBP.    Time 6   Period Weeks   Status New               Plan - 10/26/15 1749    Clinical Impression Statement Treatment focused on core strengthening and improving spinal mobility. Pt was taught pelvic tilts in supine, seated, and standing position in order for her to be able to correct posture and decrease LBP when sitting at work. Pt required verbal, tactile, and visual cueing to complete properly in sitting and in standing. Core stabilization exercises were progressed today with addition of bent knee fall out with abdominal brace, seated march with abdominal brace, and theraband pulldown with abdominal bracing. Pt required verbal cueing to maintain abdominal contraction with all therex. Manual therapy was completed at end of session, pt demonstrated increased tightness in thoracic and lumbar paraspinals and in QL, on R side > L side. Pt reported decreased pain post treatment.    PT Next Visit Plan Review pelvic tilts, progress core stabilzation in supine, continue with manual therapy to address tightness in lumbar spine        Problem List There are no active problems to display for this patient.   [name], PT, DPT [phone] 10/26/2015, 6:38 PM  [place] [facility] [address] [place], [place], [address] Phone: [phone]   Fax:  [phone]  Name: JOVANNI ECKHART MRN: [id number] Date of Birth: 1976-07-06

## 2015-10-27 ENCOUNTER — Ambulatory Visit (HOSPITAL_COMMUNITY): Payer: BLUE CROSS/BLUE SHIELD

## 2015-10-27 DIAGNOSIS — R293 Abnormal posture: Secondary | ICD-10-CM

## 2015-10-27 DIAGNOSIS — M545 Low back pain: Secondary | ICD-10-CM | POA: Diagnosis not present

## 2015-10-27 DIAGNOSIS — R29898 Other symptoms and signs involving the musculoskeletal system: Secondary | ICD-10-CM

## 2015-10-27 DIAGNOSIS — M2560 Stiffness of unspecified joint, not elsewhere classified: Secondary | ICD-10-CM

## 2015-10-27 DIAGNOSIS — R198 Other specified symptoms and signs involving the digestive system and abdomen: Secondary | ICD-10-CM

## 2015-10-27 NOTE — Therapy (Signed)
[facility] [address] [place], [place], [address] Phone: [phone]   Fax:  [phone]  Physical Therapy Treatment  Patient Details  Name: Latoya Espinoza MRN: [id number] Date of Birth: 11/25/1976 Referring Provider: Dr. Laurian Brim  Encounter Date: 10/27/2015      PT End of Session - 10/27/15 1747    Visit Number 3   Number of Visits 12   Date for PT Re-Evaluation 11/19/15   Authorization Type BCBS   Authorization Time Period 10/20/15-12/20/15   PT Start Time 1738   PT Stop Time 1825   PT Time Calculation (min) 47 min   Activity Tolerance Patient tolerated treatment well   Behavior During Therapy [facility] for tasks assessed/performed      No past medical history on file.  No past surgical history on file.  There were no vitals filed for this visit.  Visit Diagnosis:  Bilateral low back pain, unspecified chronicity, with sciatica presence unspecified  Abdominal weakness  Poor posture  Weakness of both legs  Joint stiffness of spine      Subjective Assessment - 10/27/15 1746    Subjective Pt stated it's a good day, reports she had a good night sleep last night.   Currently in Pain? No/denies            [facility] Adult PT Treatment/Exercise - 10/27/15 0001    Lumbar Exercises: Stretches   Active Hamstring Stretch 2 reps;30 seconds   Active Hamstring Stretch Limitations 12in step   Single Knee to Chest Stretch 10 seconds;5 reps   Pelvic Tilt Limitations 15 reps, 3 second hold   Lumbar Exercises: Standing   Other Standing Lumbar Exercises 3D hip excursions   Other Standing Lumbar Exercises standing anterior/posterior pelvic tilts against door with tactile and visual cueing   Lumbar Exercises: Seated   Other Seated Lumbar Exercises seated march with ab brace x 10 bilat; pelvic tilt 10x 5"   Lumbar Exercises: Supine   Ab Set 15 reps;3 seconds   Clam 10 reps;5 seconds   Bent Knee Raise 10 reps;5 seconds   Bent  Knee Raise Limitations with pelvic tilt   Bridge 15 reps   Lumbar Exercises: Sidelying   Hip Abduction 15 reps   Lumbar Exercises: Prone   Straight Leg Raise 10 reps   Manual Therapy   Manual Therapy Soft tissue mobilization   Soft tissue mobilization STM to bilateral lumbar paraspinals and QL, focus on R>L side             PT Short Term Goals - 10/20/15 1633    PT SHORT TERM GOAL #1   Title Pt will be independent with HEP.   Time 3   Period Weeks   Status New   PT SHORT TERM GOAL #2   Title Increase lumbar flexion to 70 degrees or greater to allow pt to bend forward without pain.    Time 3   Period Weeks   Status New   PT SHORT TERM GOAL #3   Title Improve BLE strength as evidenced by five time sit to stand time of 13 seconds or less.    Time 3   Period Weeks   Status New   PT SHORT TERM GOAL #4   Title Pt will report being able to sit for 2 hours at work without increased back pain.    Time 3   Period Weeks   Status New           PT  Long Term Goals - 10/20/15 1635    PT LONG TERM GOAL #1   Title Pt will be independent with advanced HEP for core stabilization.   Time 6   Period Weeks   Status New   PT LONG TERM GOAL #2   Title Improve lumbar ROM to be full and painfree to allow for pt to bend and rotate to complete functional tasks.    Time 6   Period Weeks   Status New   PT LONG TERM GOAL #3   Title Improve BLE strength to 4+/5, with pt being able to complete five time sit to stand in 11 seconds or less.    Time 6   Period Weeks   Status New   PT LONG TERM GOAL #4   Title Pt will report working for 6 hours before onset of LBP to demonstrate improved functional activity tolerance.    Time 6   Period Weeks   Status New   PT LONG TERM GOAL #5   Title Pt will report being able to walk for a full hour on her lunch break without onset of LBP.    Time 6   Period Weeks   Status New               Plan - 10/27/15 1804    Clinical Impression  Statement Session focus on improving core strengtheing and increasing spinal mobilty.  Pt improving technique with pelvic tilts in supine though continues to have difficulty in seated and standing position, improved with tactile cueing.  Pt able to complete all exercises with cueing for core activaiton with activity.  Ended session the manual soft tissue mobalization to reduce tightness with Quadratus Lumborum and paraspinals.  No reports of increased pain throuigh session.     PT Next Visit Plan Review pelvic tilts seated and standing, progress core stabilzation in supine, continue with manual therapy to address tightness in lumbar spine        Problem List There are no active problems to display for this patient.  [address]Gilda Abboud, LPTA; CBIS [phone]  Juel BurrowCockerham, Habiba Treloar Jo 10/27/2015, 6:36 PM  [facility] [address] [place], [place], [address] Phone: [phone]   Fax:  [phone]  Name: Latoya Espinoza MRN: [id number] Date of Birth: 05/24/1976

## 2015-11-01 ENCOUNTER — Ambulatory Visit (HOSPITAL_COMMUNITY): Payer: BLUE CROSS/BLUE SHIELD | Admitting: Physical Therapy

## 2015-11-01 DIAGNOSIS — R293 Abnormal posture: Secondary | ICD-10-CM

## 2015-11-01 DIAGNOSIS — R29898 Other symptoms and signs involving the musculoskeletal system: Secondary | ICD-10-CM

## 2015-11-01 DIAGNOSIS — M545 Low back pain: Secondary | ICD-10-CM

## 2015-11-01 DIAGNOSIS — R198 Other specified symptoms and signs involving the digestive system and abdomen: Secondary | ICD-10-CM

## 2015-11-01 NOTE — Therapy (Signed)
[place] [facility] [address] [place], [place], [address] Phone: [phone]   Fax:  [phone]  Physical Therapy Treatment  Patient Details  Name: MAO LOCKNER MRN: [id number] Date of Birth: 15-Aug-1976 Referring Provider: Dr. Laurian Brim  Encounter Date: 11/01/2015      PT End of Session - 11/01/15 0856    Visit Number 4   Number of Visits 12   Date for PT Re-Evaluation 11/19/15   Authorization Type BCBS   Authorization Time Period 10/20/15-12/20/15   PT Start Time 0811   PT Stop Time 0845   PT Time Calculation (min) 34 min   Activity Tolerance Patient tolerated treatment well   Behavior During Therapy [facility] for tasks assessed/performed      No past medical history on file.  No past surgical history on file.  There were no vitals filed for this visit.  Visit Diagnosis:  Bilateral low back pain, unspecified chronicity, with sciatica presence unspecified  Abdominal weakness  Poor posture  Weakness of both legs      Subjective Assessment - 11/01/15 0813    Subjective Pt reports that her knee is really bothering her today. She did a lot of walking on Saturday, and that night her knee started hurting. She rates her back pain as a 3-4/10, knee pain 7/10.    Currently in Pain? Yes   Pain Score 4    Pain Location Back   Pain Orientation Lower   Multiple Pain Sites Yes   Pain Score 7   Pain Location Knee   Pain Orientation Right                 [facility] Adult PT Treatment/Exercise - 11/01/15 0001    Lumbar Exercises: Stretches   Active Hamstring Stretch 2 reps;30 seconds   Active Hamstring Stretch Limitations 12in step   Single Knee to Chest Stretch 10 seconds;5 reps   Lumbar Exercises: Standing   Shoulder Extension 10 reps   Theraband Level (Shoulder Extension) Level 3 (Green)   Other Standing Lumbar Exercises standing oblique punch with GTB x 10   Other Standing Lumbar Exercises standing anterior/posterior  pelvic tilts against door with tactile and visual cueing   Lumbar Exercises: Seated   Other Seated Lumbar Exercises seated on dynadisc: march with ab brace x 10 bilat; pelvic tilt 10x 5"   Lumbar Exercises: Supine   Bent Knee Raise 10 reps;5 seconds   Bent Knee Raise Limitations with pelvic tilt   Bridge 15 reps   Other Supine Lumbar Exercises bent knee fall out with ab set x 10   Lumbar Exercises: Sidelying   Hip Abduction 15 reps   Lumbar Exercises: Prone   Straight Leg Raise 15 reps                  PT Short Term Goals - 10/20/15 1633    PT SHORT TERM GOAL #1   Title Pt will be independent with HEP.   Time 3   Period Weeks   Status New   PT SHORT TERM GOAL #2   Title Increase lumbar flexion to 70 degrees or greater to allow pt to bend forward without pain.    Time 3   Period Weeks   Status New   PT SHORT TERM GOAL #3   Title Improve BLE strength as evidenced by five time sit to stand time of 13 seconds or less.    Time 3   Period Weeks   Status New  PT SHORT TERM GOAL #4   Title Pt will report being able to sit for 2 hours at work without increased back pain.    Time 3   Period Weeks   Status New           PT Long Term Goals - 10/20/15 1635    PT LONG TERM GOAL #1   Title Pt will be independent with advanced HEP for core stabilization.   Time 6   Period Weeks   Status New   PT LONG TERM GOAL #2   Title Improve lumbar ROM to be full and painfree to allow for pt to bend and rotate to complete functional tasks.    Time 6   Period Weeks   Status New   PT LONG TERM GOAL #3   Title Improve BLE strength to 4+/5, with pt being able to complete five time sit to stand in 11 seconds or less.    Time 6   Period Weeks   Status New   PT LONG TERM GOAL #4   Title Pt will report working for 6 hours before onset of LBP to demonstrate improved functional activity tolerance.    Time 6   Period Weeks   Status New   PT LONG TERM GOAL #5   Title Pt will report  being able to walk for a full hour on her lunch break without onset of LBP.    Time 6   Period Weeks   Status New               Plan - 11/01/15 0856    Clinical Impression Statement Pt 11 minutes late for treatment session today. Continued focus on core strengthening, adding seated pelvic tilts on dynadisc and standing oblique punches. Pt required multimodal cueing for seated and standing pelvic tilts today for proper form and recruitment of musculature. Manual was held today due to time constraints. Pt denied any increased pain post treatment.   PT Next Visit Plan Continue with core strengthening, manual PRN        Problem List There are no active problems to display for this patient.   Leona SingletonLauren Tondra Reierson, PT, DPT [phone] 11/01/2015, 9:16 AM  [facility] [address] [place], [place], [address] Phone: [phone]   Fax:  [phone]  Name: Alen Bleacherracey W Ponder MRN: [id number] Date of Birth: 03/12/1976

## 2015-11-03 ENCOUNTER — Ambulatory Visit (HOSPITAL_COMMUNITY): Payer: BLUE CROSS/BLUE SHIELD | Admitting: Physical Therapy

## 2015-11-03 DIAGNOSIS — R29898 Other symptoms and signs involving the musculoskeletal system: Secondary | ICD-10-CM

## 2015-11-03 DIAGNOSIS — M545 Low back pain: Secondary | ICD-10-CM

## 2015-11-03 DIAGNOSIS — R198 Other specified symptoms and signs involving the digestive system and abdomen: Secondary | ICD-10-CM

## 2015-11-03 DIAGNOSIS — R293 Abnormal posture: Secondary | ICD-10-CM

## 2015-11-03 NOTE — Therapy (Signed)
[facility] [address] [place], [place], [address] Phone: [phone]   Fax:  [phone]  Physical Therapy Treatment  Patient Details  Name: Latoya Espinoza MRN: [id number] Date of Birth: 09/05/1976 Referring Provider: Dr. Laurian Brim'Toole  Encounter Date: 11/03/2015      PT End of Session - 11/03/15 1612    Visit Number 5   Number of Visits 12   Date for PT Re-Evaluation 11/19/15   Authorization Type BCBS   Authorization Time Period 10/20/15-12/20/15   PT Start Time 1515   PT Stop Time 1600   PT Time Calculation (min) 45 min   Activity Tolerance Patient tolerated treatment well   Behavior During Therapy [facility] for [facility] assessed/performed      No past medical history on file.  No past surgical history on file.  There were no vitals filed for this visit.  Visit Diagnosis:  Bilateral low back pain, unspecified chronicity, with sciatica presence unspecified  Abdominal weakness  Poor posture  Weakness of both legs      Subjective Assessment - 11/03/15 1521    Subjective Pt reports that her back feels ok today, rates her pain as 3/10.    Currently in Pain? Yes   Pain Score 3               [facility] Adult PT Treatment/Exercise - 11/03/15 0001    Lumbar Exercises: Stretches   Active Hamstring Stretch 2 reps;30 seconds   Active Hamstring Stretch Limitations 12in step   Single Knee to Chest Stretch 10 seconds;5 reps   Lumbar Exercises: Standing   Other Standing Lumbar Exercises standing oblique punch with GTB x 10   Other Standing Lumbar Exercises sidestepping with RTB x 2RT   Lumbar Exercises: Seated   Other Seated Lumbar Exercises seated on dynadisc: march with ab brace x 15 bilat; pelvic tilt 10x 5"   Lumbar Exercises: Supine   Bent Knee Raise 15 reps   Bent Knee Raise Limitations with pelvic tilt   Bridge 15 reps   Other Supine Lumbar Exercises bent knee fall out with ab set x 15   Other Supine Lumbar Exercises tap  downs from tabletop position x 10 bilat   Lumbar Exercises: Sidelying   Hip Abduction 15 reps   Lumbar Exercises: Quadruped   Straight Leg Raise 10 reps   Manual Therapy   Manual Therapy Soft tissue mobilization;Joint mobilization   Joint Mobilization grade I-II P/A mobs to L3/4-L4/5, grade I-II mobs to transverse processes to improve rotation   Soft tissue mobilization STM to bilateral lumbar paraspinals and QL, focus on R>L side                  PT Short Term Goals - 10/20/15 1633    PT SHORT TERM GOAL #1   Title Pt will be independent with HEP.   Time 3   Period Weeks   Status New   PT SHORT TERM GOAL #2   Title Increase lumbar flexion to 70 degrees or greater to allow pt to bend forward without pain.    Time 3   Period Weeks   Status New   PT SHORT TERM GOAL #3   Title Improve BLE strength as evidenced by five time sit to stand time of 13 seconds or less.    Time 3   Period Weeks   Status New   PT SHORT TERM GOAL #4   Title Pt will report being able to sit for  2 hours at work without increased back pain.    Time 3   Period Weeks   Status New           PT Long Term Goals - 10/20/15 1635    PT LONG TERM GOAL #1   Title Pt will be independent with advanced HEP for core stabilization.   Time 6   Period Weeks   Status New   PT LONG TERM GOAL #2   Title Improve lumbar ROM to be full and painfree to allow for pt to bend and rotate to complete functional tasks.    Time 6   Period Weeks   Status New   PT LONG TERM GOAL #3   Title Improve BLE strength to 4+/5, with pt being able to complete five time sit to stand in 11 seconds or less.    Time 6   Period Weeks   Status New   PT LONG TERM GOAL #4   Title Pt will report working for 6 hours before onset of LBP to demonstrate improved functional activity tolerance.    Time 6   Period Weeks   Status New   PT LONG TERM GOAL #5   Title Pt will report being able to walk for a full hour on her lunch break  without onset of LBP.    Time 6   Period Weeks   Status New               Plan - 11/03/15 1614    Clinical Impression Statement Continued with core strengthening and education on pelvic tilts today. Pt continues to require verbal and tactile cueing for seated pelvic tilts on dynadisc for proper muscular facilitation. Added quadruped hip extension and sidestepping with theraband today to improve hip strength and pelvic stability. Treatment session ended with manual therapy to address tight musculature and hypomobility of lower lumbar spine. Pt reported decreased pain post treatment.    PT Next Visit Plan Add fire hydrants if tolerated, continue with progression of supine core strengthening        Problem List There are no active problems to display for this patient.   [name], PT, DPT [phone] 11/03/2015, 4:17 PM   [facility] [address] [place], [place], [address] Phone: [phone]   Fax:  [phone]  Name: Latoya Espinoza MRN: [id number] Date of Birth: 04-14-1976

## 2015-11-10 ENCOUNTER — Ambulatory Visit (HOSPITAL_COMMUNITY): Payer: BLUE CROSS/BLUE SHIELD | Admitting: Physical Therapy

## 2015-11-10 DIAGNOSIS — M545 Low back pain: Secondary | ICD-10-CM

## 2015-11-10 DIAGNOSIS — R198 Other specified symptoms and signs involving the digestive system and abdomen: Secondary | ICD-10-CM

## 2015-11-10 DIAGNOSIS — R293 Abnormal posture: Secondary | ICD-10-CM

## 2015-11-10 DIAGNOSIS — R29898 Other symptoms and signs involving the musculoskeletal system: Secondary | ICD-10-CM

## 2015-11-10 NOTE — Therapy (Signed)
[facility] [facility] [address] [place], [place], [address] Phone: [phone]   Fax:  [phone]  Physical Therapy Treatment  Patient Details  Name: Latoya Espinoza MRN: [id number] Date of Birth: 01/09/1976 Referring Provider: Dr. Laurian Brim'Toole  Encounter Date: 11/10/2015      PT End of Session - 11/10/15 1615    Visit Number 6   Number of Visits 12   Date for PT Re-Evaluation 11/19/15   Authorization Type BCBS   Authorization Time Period 10/20/15-12/20/15   PT Start Time 1520   PT Stop Time 1602   PT Time Calculation (min) 42 min   Activity Tolerance Patient tolerated treatment well   Behavior During Therapy [facility] for tasks assessed/performed      No past medical history on file.  No past surgical history on file.  There were no vitals filed for this visit.  Visit Diagnosis:  Bilateral low back pain, unspecified chronicity, with sciatica presence unspecified  Abdominal weakness  Poor posture  Weakness of both legs      Subjective Assessment - 11/10/15 1523    Subjective Pt reports that her back has been feeling pretty good today. She got an injection in her knee on Monday, and she has been having severe pain in her knee ever since.    Currently in Pain? Yes   Pain Score 2    Pain Location Back   Pain Score 6   Pain Location Knee   Pain Orientation Right                         [facility] Adult PT Treatment/Exercise - 11/10/15 0001    Lumbar Exercises: Stretches   Active Hamstring Stretch 2 reps;30 seconds   Active Hamstring Stretch Limitations 14" step   Single Knee to Chest Stretch 10 seconds;5 reps   Quadruped Mid Back Stretch Limitations seated swiss ball roll otu 10x10"   Lumbar Exercises: Standing   Wall Slides 15 reps   Shoulder Extension 15 reps   Theraband Level (Shoulder Extension) Level 3 (Green)   Other Standing Lumbar Exercises standing oblique punch with GTB x 15, standing hip extension with  trunk flexed x 15   Other Standing Lumbar Exercises sidestepping with RTB x 2RT   Lumbar Exercises: Supine   Bent Knee Raise 15 reps   Bent Knee Raise Limitations with pelvic tilt   Bridge 20 reps   Other Supine Lumbar Exercises bent knee fall out with ab set x 15   Other Supine Lumbar Exercises tap downs from tabletop position x 10 bilat   Lumbar Exercises: Sidelying   Hip Abduction 15 reps   Lumbar Exercises: Prone   Straight Leg Raise 15 reps                  PT Short Term Goals - 10/20/15 1633    PT SHORT TERM GOAL #1   Title Pt will be independent with HEP.   Time 3   Period Weeks   Status New   PT SHORT TERM GOAL #2   Title Increase lumbar flexion to 70 degrees or greater to allow pt to bend forward without pain.    Time 3   Period Weeks   Status New   PT SHORT TERM GOAL #3   Title Improve BLE strength as evidenced by five time sit to stand time of 13 seconds or less.    Time 3   Period Weeks  Status New   PT SHORT TERM GOAL #4   Title Pt will report being able to sit for 2 hours at work without increased back pain.    Time 3   Period Weeks   Status New           PT Long Term Goals - 10/20/15 1635    PT LONG TERM GOAL #1   Title Pt will be independent with advanced HEP for core stabilization.   Time 6   Period Weeks   Status New   PT LONG TERM GOAL #2   Title Improve lumbar ROM to be full and painfree to allow for pt to bend and rotate to complete functional tasks.    Time 6   Period Weeks   Status New   PT LONG TERM GOAL #3   Title Improve BLE strength to 4+/5, with pt being able to complete five time sit to stand in 11 seconds or less.    Time 6   Period Weeks   Status New   PT LONG TERM GOAL #4   Title Pt will report working for 6 hours before onset of LBP to demonstrate improved functional activity tolerance.    Time 6   Period Weeks   Status New   PT LONG TERM GOAL #5   Title Pt will report being able to walk for a full hour on her  lunch break without onset of LBP.    Time 6   Period Weeks   Status New               Plan - 11/10/15 1616    Clinical Impression Statement Continued with focus on core and hip strengthening today. Wall squats and standing hip extension were added today to improve quad and glut strenght, pt required verbal and tactle cueing to keep back flat against wall with wall slides. Quadruped exercises were held today due to increased knee pain. Pt will benefit from resuming quadruped exercises in order to further strengthen lumbar spine and hip. Pt reported 0/10 pain in back following treatment.    PT Next Visit Plan Resume quadruped strengthening if tolerated        Problem List There are no active problems to display for this patient.   [name], PT, DPT [phone] 11/10/2015, 4:21 PM  [place] [address] [place], [place], [address] Phone: [phone]   Fax:  [phone]  Name: Latoya Espinoza MRN: [id number] Date of Birth: [birth year]

## 2015-11-12 ENCOUNTER — Ambulatory Visit (HOSPITAL_COMMUNITY): Payer: BLUE CROSS/BLUE SHIELD | Admitting: Physical Therapy

## 2015-11-12 DIAGNOSIS — R29898 Other symptoms and signs involving the musculoskeletal system: Secondary | ICD-10-CM

## 2015-11-12 DIAGNOSIS — R293 Abnormal posture: Secondary | ICD-10-CM

## 2015-11-12 DIAGNOSIS — R198 Other specified symptoms and signs involving the digestive system and abdomen: Secondary | ICD-10-CM

## 2015-11-12 DIAGNOSIS — M545 Low back pain: Secondary | ICD-10-CM | POA: Diagnosis not present

## 2015-11-12 NOTE — Therapy (Signed)
[facility] [address] [place], [place], [address] Phone: [phone]   Fax:  [phone]  Physical Therapy Treatment  Patient Details  Name: Latoya Espinoza MRN: [id number] Date of Birth: [birth year] Referring Provider: Dr. Laurian Brim  Encounter Date: 11/12/2015      PT End of Session - 11/12/15 1609    Visit Number 7   Number of Visits 12   Date for PT Re-Evaluation 11/19/15   Authorization Type BCBS   Authorization Time Period 10/20/15-12/20/15   PT Start Time 1516   PT Stop Time 1600   PT Time Calculation (min) 44 min   Activity Tolerance Patient tolerated treatment well   Behavior During Therapy [facility] for tasks assessed/performed      No past medical history on file.  No past surgical history on file.  There were no vitals filed for this visit.  Visit Diagnosis:  Bilateral low back pain, unspecified chronicity, with sciatica presence unspecified  Abdominal weakness  Poor posture  Weakness of both legs      Subjective Assessment - 11/12/15 1521    Subjective Pt reports that her knee is feeling much better today. She reports that her back was a little stiff this morning, but her pain is about a 1/10 now.    Currently in Pain? Yes   Pain Score 1    Pain Location Back               [facility] Adult PT Treatment/Exercise - 11/12/15 0001    Exercises   Exercises Lumbar   Lumbar Exercises: Stretches   Active Hamstring Stretch 2 reps;30 seconds   Active Hamstring Stretch Limitations 12in step   Single Knee to Chest Stretch 10 seconds;5 reps   Lumbar Exercises: Standing   Other Standing Lumbar Exercises standing oblique punch with GTB x 15, standing hip extension with trunk flexed x 15   Other Standing Lumbar Exercises sidestepping with GTB x 2RT   Lumbar Exercises: Supine   Dead Bug 10 reps   Bridge 15 reps   Bridge Limitations +5 single leg bridges bilaterally, +10 with legs on swiss ball   Other Supine  Lumbar Exercises tap downs from tabletop position x 15 bilat   Lumbar Exercises: Sidelying   Hip Abduction 15 reps   Lumbar Exercises: Quadruped   Straight Leg Raise 10 reps   Opposite Arm/Leg Raise 10 reps   Other Quadruped Lumbar Exercises fire hydrant x 10 bilat   Manual Therapy   Manual Therapy Soft tissue mobilization   Soft tissue mobilization STM to bilateral lumbar paraspinals and QL, focus on R>L side                  PT Short Term Goals - 10/20/15 1633    PT SHORT TERM GOAL #1   Title Pt will be independent with HEP.   Time 3   Period Weeks   Status New   PT SHORT TERM GOAL #2   Title Increase lumbar flexion to 70 degrees or greater to allow pt to bend forward without pain.    Time 3   Period Weeks   Status New   PT SHORT TERM GOAL #3   Title Improve BLE strength as evidenced by five time sit to stand time of 13 seconds or less.    Time 3   Period Weeks   Status New   PT SHORT TERM GOAL #4   Title Pt will report being able to sit for  2 hours at work without increased back pain.    Time 3   Period Weeks   Status New           PT Long Term Goals - 10/20/15 1635    PT LONG TERM GOAL #1   Title Pt will be independent with advanced HEP for core stabilization.   Time 6   Period Weeks   Status New   PT LONG TERM GOAL #2   Title Improve lumbar ROM to be full and painfree to allow for pt to bend and rotate to complete functional tasks.    Time 6   Period Weeks   Status New   PT LONG TERM GOAL #3   Title Improve BLE strength to 4+/5, with pt being able to complete five time sit to stand in 11 seconds or less.    Time 6   Period Weeks   Status New   PT LONG TERM GOAL #4   Title Pt will report working for 6 hours before onset of LBP to demonstrate improved functional activity tolerance.    Time 6   Period Weeks   Status New   PT LONG TERM GOAL #5   Title Pt will report being able to walk for a full hour on her lunch break without onset of LBP.     Time 6   Period Weeks   Status New               Plan - 11/12/15 1609    Clinical Impression Statement Progressed functional strengthening with addition of quaduped arm/leg lift and fire hydrants to improve core and hip strength. Pt is demonstrating increased functional activity tolerance and improvements in pain levels as treatment progresses. Pt required verbal and tactile cueing to complete quadruped arm/leg lift with proper form and to maintain level pelvis. Soft tissue mobilization completed following therex to address muscular tightness in lumbar spine.    PT Next Visit Plan Continue with core strengthening, add seated hip flexion on ball        Problem List There are no active problems to display for this patient.   Leona SingletonLauren Maryclare Nydam, PT, DPT [phone] 11/12/2015, 4:13 PM  [facility] [address] [place], [place], [address] Phone: [phone]   Fax:  [phone]  Name: Latoya Bleacherracey W Hoon MRN: [id number] Date of Birth: 06/09/1976

## 2015-11-15 ENCOUNTER — Ambulatory Visit (HOSPITAL_COMMUNITY): Payer: BLUE CROSS/BLUE SHIELD | Admitting: Physical Therapy

## 2015-11-15 DIAGNOSIS — M545 Low back pain: Secondary | ICD-10-CM | POA: Diagnosis not present

## 2015-11-15 DIAGNOSIS — R29898 Other symptoms and signs involving the musculoskeletal system: Secondary | ICD-10-CM

## 2015-11-15 DIAGNOSIS — R198 Other specified symptoms and signs involving the digestive system and abdomen: Secondary | ICD-10-CM

## 2015-11-15 DIAGNOSIS — R293 Abnormal posture: Secondary | ICD-10-CM

## 2015-11-15 NOTE — Therapy (Signed)
[facility] [address] [place], [place], [address] Phone: [phone]   Fax:  [phone]  Physical Therapy Treatment  Patient Details  Name: Latoya Espinoza MRN: [id number] Date of Birth: 1976-01-14 Referring Provider: Dr. Laurian Brim  Encounter Date: 11/15/2015      PT End of Session - 11/15/15 1607    Visit Number 8   Number of Visits 12   Date for PT Re-Evaluation 11/19/15   Authorization Type BCBS   Authorization Time Period 10/20/15-12/20/15   PT Start Time 1515   PT Stop Time 1559   PT Time Calculation (min) 44 min   Activity Tolerance Patient tolerated treatment well   Behavior During Therapy [facility] for tasks assessed/performed      No past medical history on file.  No past surgical history on file.  There were no vitals filed for this visit.  Visit Diagnosis:  Bilateral low back pain, unspecified chronicity, with sciatica presence unspecified  Abdominal weakness  Poor posture  Weakness of both legs      Subjective Assessment - 11/15/15 1517    Subjective Pt reports that her back feels good today.    Currently in Pain? Yes   Pain Score 2                 [facility] Adult PT Treatment/Exercise - 11/15/15 0001    Lumbar Exercises: Stretches   Active Hamstring Stretch 3 reps;30 seconds   Active Hamstring Stretch Limitations 12 inch step   Single Knee to Chest Stretch 10 seconds;5 reps   Lumbar Exercises: Standing   Shoulder Extension 15 reps   Theraband Level (Shoulder Extension) Level 4 (Blue)   Other Standing Lumbar Exercises standing oblique punch with BTB x 15   Other Standing Lumbar Exercises sidestepping with GTB x 2RT. monster walk with GTB x 2RT   Lumbar Exercises: Seated   Other Seated Lumbar Exercises seated march on dynadisc x 15   Lumbar Exercises: Supine   Dead Bug 10 reps  2x5 reps with feet in tabletop position   Bridge 15 reps   Bridge Limitations +5 single leg bridges bilaterally,  +10 with legs on swiss ball   Other Supine Lumbar Exercises tap downs from tabletop position x 15 bilat   Lumbar Exercises: Sidelying   Hip Abduction 15 reps   Hip Abduction Weights (lbs) 3   Lumbar Exercises: Quadruped   Straight Leg Raise 15 reps   Opposite Arm/Leg Raise 10 reps   Other Quadruped Lumbar Exercises fire hydrant x 10 bilat   Manual Therapy   Manual Therapy Soft tissue mobilization   Manual therapy comments following therex   Soft tissue mobilization STM to bilateral lumbar paraspinals and QL, focus on R>L side                  PT Short Term Goals - 10/20/15 1633    PT SHORT TERM GOAL #1   Title Pt will be independent with HEP.   Time 3   Period Weeks   Status New   PT SHORT TERM GOAL #2   Title Increase lumbar flexion to 70 degrees or greater to allow pt to bend forward without pain.    Time 3   Period Weeks   Status New   PT SHORT TERM GOAL #3   Title Improve BLE strength as evidenced by five time sit to stand time of 13 seconds or less.    Time 3   Period Weeks  Status New   PT SHORT TERM GOAL #4   Title Pt will report being able to sit for 2 hours at work without increased back pain.    Time 3   Period Weeks   Status New           PT Long Term Goals - 10/20/15 1635    PT LONG TERM GOAL #1   Title Pt will be independent with advanced HEP for core stabilization.   Time 6   Period Weeks   Status New   PT LONG TERM GOAL #2   Title Improve lumbar ROM to be full and painfree to allow for pt to bend and rotate to complete functional tasks.    Time 6   Period Weeks   Status New   PT LONG TERM GOAL #3   Title Improve BLE strength to 4+/5, with pt being able to complete five time sit to stand in 11 seconds or less.    Time 6   Period Weeks   Status New   PT LONG TERM GOAL #4   Title Pt will report working for 6 hours before onset of LBP to demonstrate improved functional activity tolerance.    Time 6   Period Weeks   Status New   PT  LONG TERM GOAL #5   Title Pt will report being able to walk for a full hour on her lunch break without onset of LBP.    Time 6   Period Weeks   Status New               Plan - 11/15/15 1609    Clinical Impression Statement Continued with core strengthening in today's treatment, progressing dead bug exercise to be performed from tabletop position. Pt was able to complete with good form, but required verbal cueing to prevent holding her breath. Monster walks with blue tband and seated march on dynadisc were added with good form and min cueing. Soft tissue mobilization completed following therex to decrease soft tissue tightness in bilateral quadratus lumborum and paraspinals. Pt denied any increased pain post treatment.     PT Next Visit Plan Add holds for quadruped arm/leg extension        Problem List There are no active problems to display for this patient.   Leona SingletonLauren Shalinda Burkholder, PT, DPT [phone] 11/15/2015, 4:13 PM  [facility] [place] [address] [place], [place], [address] Phone: [phone]   Fax:  [phone]  Name: Alen Bleacherracey W Fricke MRN: [id number] Date of Birth: 04/13/1976

## 2015-11-17 ENCOUNTER — Ambulatory Visit (HOSPITAL_COMMUNITY): Payer: BLUE CROSS/BLUE SHIELD | Admitting: Physical Therapy

## 2015-11-17 DIAGNOSIS — R198 Other specified symptoms and signs involving the digestive system and abdomen: Secondary | ICD-10-CM

## 2015-11-17 DIAGNOSIS — R29898 Other symptoms and signs involving the musculoskeletal system: Secondary | ICD-10-CM

## 2015-11-17 DIAGNOSIS — M545 Low back pain: Secondary | ICD-10-CM | POA: Diagnosis not present

## 2015-11-17 DIAGNOSIS — M2560 Stiffness of unspecified joint, not elsewhere classified: Secondary | ICD-10-CM

## 2015-11-17 DIAGNOSIS — R293 Abnormal posture: Secondary | ICD-10-CM

## 2015-11-17 NOTE — Therapy (Signed)
[facility] [address] [place], [place], [address] Phone: [phone]   Fax:  [phone]  Physical Therapy Treatment  Patient Details  Name: Latoya Espinoza MRN: [id number] Date of Birth: [birth year] Referring Provider: Dr. Laurian Brim  Encounter Date: 11/17/2015      PT End of Session - 11/17/15 1602    Visit Number 9   Number of Visits 12   Date for PT Re-Evaluation 11/19/15   Authorization Type BCBS   Authorization Time Period 10/20/15-12/20/15   PT Start Time 1520   PT Stop Time 1600   PT Time Calculation (min) 40 min   Activity Tolerance Patient tolerated treatment well   Behavior During Therapy [facility] for tasks assessed/performed      No past medical history on file.  No past surgical history on file.  There were no vitals filed for this visit.  Visit Diagnosis:  Bilateral low back pain, unspecified chronicity, with sciatica presence unspecified  Abdominal weakness  Poor posture  Weakness of both legs  Joint stiffness of spine      Subjective Assessment - 11/17/15 1607    Subjective Pt states minimal discomfort today, however feels she slept on it wrong last night and that is why.   Currently in Pain? Yes   Pain Score 2    Pain Location Back   Pain Orientation Lower                         [facility] Adult PT Treatment/Exercise - 11/17/15 1601    Lumbar Exercises: Stretches   Active Hamstring Stretch 3 reps;30 seconds   Active Hamstring Stretch Limitations 12 inch step   Single Knee to Chest Stretch 10 seconds;5 reps   Lumbar Exercises: Standing   Shoulder Extension 15 reps   Theraband Level (Shoulder Extension) Level 4 (Blue)   Other Standing Lumbar Exercises standing oblique punch with BTB x 15   Other Standing Lumbar Exercises sidestepping with GTB x 2RT. monster walk with GTB x 2RT   Lumbar Exercises: Supine   Dead Bug 10 reps   Bridge 15 reps   Bridge Limitations +5 single leg  bridges bilaterally, +10 with legs on swiss ball   Large Ball Abdominal Isometric 10 reps   Large Ball Oblique Isometric 10 reps   Other Supine Lumbar Exercises tap downs from tabletop position x 15 bilat   Lumbar Exercises: Sidelying   Hip Abduction 15 reps   Hip Abduction Weights (lbs) 3   Lumbar Exercises: Prone   Straight Leg Raise 15 reps   Lumbar Exercises: Quadruped   Opposite Arm/Leg Raise 15 reps   Other Quadruped Lumbar Exercises fire hydrant x 15 bilat                  PT Short Term Goals - 10/20/15 1633    PT SHORT TERM GOAL #1   Title Pt will be independent with HEP.   Time 3   Period Weeks   Status New   PT SHORT TERM GOAL #2   Title Increase lumbar flexion to 70 degrees or greater to allow pt to bend forward without pain.    Time 3   Period Weeks   Status New   PT SHORT TERM GOAL #3   Title Improve BLE strength as evidenced by five time sit to stand time of 13 seconds or less.    Time 3   Period Weeks   Status New  PT SHORT TERM GOAL #4   Title Pt will report being able to sit for 2 hours at work without increased back pain.    Time 3   Period Weeks   Status New           PT Long Term Goals - 10/20/15 1635    PT LONG TERM GOAL #1   Title Pt will be independent with advanced HEP for core stabilization.   Time 6   Period Weeks   Status New   PT LONG TERM GOAL #2   Title Improve lumbar ROM to be full and painfree to allow for pt to bend and rotate to complete functional tasks.    Time 6   Period Weeks   Status New   PT LONG TERM GOAL #3   Title Improve BLE strength to 4+/5, with pt being able to complete five time sit to stand in 11 seconds or less.    Time 6   Period Weeks   Status New   PT LONG TERM GOAL #4   Title Pt will report working for 6 hours before onset of LBP to demonstrate improved functional activity tolerance.    Time 6   Period Weeks   Status New   PT LONG TERM GOAL #5   Title Pt will report being able to walk for  a full hour on her lunch break without onset of LBP.    Time 6   Period Weeks   Status New               Plan - 11/17/15 1603    Clinical Impression Statement Continued core strengthening progression.  Added isometric abdominal for rectus and obliques using physioball.  Pt required one rest between sets of tabletop position toe tapping actvity.  Pt able to complete exercises in good form overall but sitill with noted weakness controlling stability in quadruped exercises.     PT Next Visit Plan Progress holds for quadruped arm/leg extension.          Problem List There are no active problems to display for this patient.   Latoya Espinoza, PTA/CLT [phone] 11/17/2015, 4:09 PM  [facility] [address] [place], [place], [address] Phone: [phone]   Fax:  [phone]  Name: Latoya Espinoza MRN: [id number] Date of Birth: 11/02/1976

## 2015-11-23 ENCOUNTER — Ambulatory Visit (HOSPITAL_COMMUNITY): Payer: BLUE CROSS/BLUE SHIELD | Admitting: Physical Therapy

## 2015-11-23 DIAGNOSIS — M545 Low back pain: Secondary | ICD-10-CM | POA: Diagnosis not present

## 2015-11-23 DIAGNOSIS — R198 Other specified symptoms and signs involving the digestive system and abdomen: Secondary | ICD-10-CM

## 2015-11-23 DIAGNOSIS — R293 Abnormal posture: Secondary | ICD-10-CM

## 2015-11-23 DIAGNOSIS — R29898 Other symptoms and signs involving the musculoskeletal system: Secondary | ICD-10-CM

## 2015-11-23 NOTE — Therapy (Signed)
[place], [place], [address] Phone: [phone]   Fax:  [phone]  Physical Therapy Treatment  Patient Details  Name: Latoya Espinoza MRN: [id number] Date of Birth: 06/08/1976 Referring Provider: Dr. Francesco Runner  Encounter Date: 11/23/2015      PT End of Session - 11/23/15 1649    Visit Number 10   Number of Visits 16   Date for PT Re-Evaluation 12/20/15   Authorization Type BCBS   Authorization Time Period 10/20/15-12/20/15   PT Start Time 1515   PT Stop Time 1601   PT Time Calculation (min) 46 min   Activity Tolerance Patient tolerated treatment well   Behavior During Therapy [facility] for tasks assessed/performed      No past medical history on file.  No past surgical history on file.  There were no vitals filed for this visit.  Visit Diagnosis:  Bilateral low back pain, unspecified chronicity, with sciatica presence unspecified  Abdominal weakness  Poor posture  Weakness of both legs      Subjective Assessment - 11/23/15 1516    Subjective Pt reports that she has noticed a large improvement in her back pain since beginning therapy, she feels that she has gotten stronger. She reports that she still feels that she has some weakness in her core, but for the most part, she feels that she has improved overall.    How long can you sit comfortably? one hour   How long can you stand comfortably? 30-45 minutes if cleaning   How long can you walk comfortably? one hour   Currently in Pain? No/denies   Pain Score 0-No pain            [facility] PT Assessment - 11/23/15 0001    Observation/Other Assessments   Focus on Therapeutic Outcomes (FOTO)  46% limited   AROM   Right Hip External Rotation  48   Right Hip Internal Rotation  40   Left Hip External Rotation  49   Left Hip Internal Rotation  37   Lumbar Flexion 90   Lumbar Extension 20   Lumbar - Right Side Bend 23   Lumbar - Left Side Bend 35   Strength   Overall Strength Comments ab strength 3/5   Right Hip Flexion 4/5   Right Hip Extension 4-/5   Right Hip ABduction 4/5   Left Hip Flexion 4/5   Left Hip Extension 4-/5   Left Hip ABduction 4/5   Right Knee Flexion 4+/5   Right Knee Extension 4+/5   Left Knee Flexion 4+/5   Left Knee Extension 5/5   Straight Leg Raise   Findings Negative   Comment 64 degrees   Transfers   Five time sit to stand comments  10.58"                     [facility] Adult PT Treatment/Exercise - 11/23/15 0001    Lumbar Exercises: Stretches   Active Hamstring Stretch 3 reps;30 seconds   Active Hamstring Stretch Limitations 12 inch step   Single Knee to Chest Stretch 10 seconds;5 reps   Lumbar Exercises: Standing   Other Standing Lumbar Exercises standing oblique bends x 10 bilat   Other Standing Lumbar Exercises sidestepping with GTB x 2RT. monster walk with GTB x 2RT   Lumbar Exercises: Seated   Long Arc Quad on [place] 10 reps   Hip Flexion on [place] 10 reps   Lumbar Exercises: Supine  Dead Bug 10 reps   Bridge 15 reps  on swiss ball   Bridge Limitations +5 single leg bridges bilaterally on swiss ball   Other Supine Lumbar Exercises tap downs from tabletop position x 15 bilat                  PT Short Term Goals - 11/23/15 1539    PT SHORT TERM GOAL #1   Title Pt will be independent with HEP.   Time 3   Period Weeks   Status Achieved   PT SHORT TERM GOAL #2   Title Increase lumbar flexion to 70 degrees or greater to allow pt to bend forward without pain.    Time 3   Period Weeks   Status Achieved   PT SHORT TERM GOAL #3   Title Improve BLE strength as evidenced by five time sit to stand time of 13 seconds or less.    Time 3   Period Weeks   Status Achieved   PT SHORT TERM GOAL #4   Title Pt will report being able to sit for 2 hours at work without increased back pain.    Time 3   Period Weeks   Status On-going           PT Long Term Goals - 11/23/15  1540    PT LONG TERM GOAL #1   Title Pt will be independent with advanced HEP for core stabilization.   Time 6   Period Weeks   Status On-going   PT LONG TERM GOAL #2   Title Improve lumbar ROM to be full and painfree to allow for pt to bend and rotate to complete functional tasks.    Time 6   Period Weeks   Status On-going   PT LONG TERM GOAL #3   Title Improve BLE strength to 4+/5, with pt being able to complete five time sit to stand in 11 seconds or less.    Time 6   Period Weeks   Status Partially Met   PT LONG TERM GOAL #4   Title Pt will report working for 6 hours before onset of LBP to demonstrate improved functional activity tolerance.    Time 6   Period Weeks   Status On-going   PT LONG TERM GOAL #5   Title Pt will report being able to walk for a full hour on her lunch break without onset of LBP.    Time 6   Period Weeks   Status Achieved               Plan - 11/23/15 1650    Clinical Impression Statement Reassessment completed today, pt is making excellent progress towards her LTGs. She demosntrates improvements in BLE strength, core strength, lumbar ROM, and functional activity tolerance. Pt will benefit from continued PT services for 4-6 more visits to further improve strength of abdominals in order to decrease LBP and allow her to return to full day of working without LBP.  LAQ and hip flexion on ball were added today to improve core strength, pt required verbal cueing for form and speed of exercise. Pt denied any increased pain post treatment.    PT Treatment/Interventions ADLs/Self Care Home Management;Moist Heat;Traction;Gait training;Stair training;Functional mobility training;Therapeutic activities;Therapeutic exercise;Neuromuscular re-education;Patient/family education;Manual techniques   PT Next Visit Plan Progress holds for quadruped arm/leg extension.          Problem List There are no active problems to display for this patient.   Ander Purpura  Megan Salon, [place], DPT [phone] 11/23/2015, 4:54 PM  [place] [address] [place], [place], [address] Phone: [phone]   Fax:  [phone]  Name: EIRENE RATHER MRN: [id number] Date of Birth: 1976/03/27

## 2015-11-25 ENCOUNTER — Ambulatory Visit (HOSPITAL_COMMUNITY): Payer: BLUE CROSS/BLUE SHIELD | Attending: Anesthesiology | Admitting: Physical Therapy

## 2015-11-25 DIAGNOSIS — R293 Abnormal posture: Secondary | ICD-10-CM | POA: Diagnosis present

## 2015-11-25 DIAGNOSIS — R198 Other specified symptoms and signs involving the digestive system and abdomen: Secondary | ICD-10-CM | POA: Insufficient documentation

## 2015-11-25 DIAGNOSIS — R29898 Other symptoms and signs involving the musculoskeletal system: Secondary | ICD-10-CM

## 2015-11-25 DIAGNOSIS — M2560 Stiffness of unspecified joint, not elsewhere classified: Secondary | ICD-10-CM | POA: Insufficient documentation

## 2015-11-25 DIAGNOSIS — M545 Low back pain: Secondary | ICD-10-CM | POA: Insufficient documentation

## 2015-11-25 NOTE — Therapy (Signed)
[place], [place], [address] Phone: [phone]   Fax:  [phone]  Physical Therapy Treatment  Patient Details  Name: Latoya Espinoza MRN: [id number] Date of Birth: [birth year] Referring Provider: Dr. Francesco Runner  Encounter Date: 11/25/2015      PT End of Session - 11/25/15 1616    Visit Number 11   Number of Visits 16   Date for PT Re-Evaluation 12/20/15   Authorization Type BCBS   Authorization Time Period 10/20/15-12/20/15   PT Start Time 1517   PT Stop Time 1557   PT Time Calculation (min) 40 min   Activity Tolerance Patient tolerated treatment well   Behavior During Therapy [facility] for tasks assessed/performed      No past medical history on file.  No past surgical history on file.  There were no vitals filed for this visit.  Visit Diagnosis:  Bilateral low back pain, unspecified chronicity, with sciatica presence unspecified  Abdominal weakness  Poor posture  Weakness of both legs      Subjective Assessment - 11/25/15 1522    Subjective Pt reports that her back feels good today, but her stomach is bothering her   Currently in Pain? No/denies   Pain Score 0-No pain                         [facility] Adult PT Treatment/Exercise - 11/25/15 0001    Lumbar Exercises: Stretches   Active Hamstring Stretch 3 reps;30 seconds   Active Hamstring Stretch Limitations 12 inch step   Single Knee to Chest Stretch 10 seconds;5 reps   Lumbar Exercises: Standing   Wall Slides 15 reps   Row Limitations standing chop with GTB x 10 bilat   Shoulder Extension 15 reps   Theraband Level (Shoulder Extension) Level 4 (Blue)   Other Standing Lumbar Exercises standing oblique bends x 15 bilat with 3lbs, oblique punches with BTB x 15   Other Standing Lumbar Exercises sidestepping with GTB x 2RT. monster walk with GTB x 2RT   Lumbar Exercises: Seated   Long Arc Quad on [place] 15 reps   Hip Flexion on  [place] 15 reps   Lumbar Exercises: Supine   Dead Bug 10 reps   Bridge 15 reps  on swiss ball   Bridge Limitations +5 single leg bridges bilaterally on swiss ball   Other Supine Lumbar Exercises tap downs from tabletop position x 15 bilat   Lumbar Exercises: Quadruped   Opposite Arm/Leg Raise 15 reps   Other Quadruped Lumbar Exercises fire hydrant x 15 bilat                  PT Short Term Goals - 11/23/15 1539    PT SHORT TERM GOAL #1   Title Pt will be independent with HEP.   Time 3   Period Weeks   Status Achieved   PT SHORT TERM GOAL #2   Title Increase lumbar flexion to 70 degrees or greater to allow pt to bend forward without pain.    Time 3   Period Weeks   Status Achieved   PT SHORT TERM GOAL #3   Title Improve BLE strength as evidenced by five time sit to stand time of 13 seconds or less.    Time 3   Period Weeks   Status Achieved   PT SHORT TERM GOAL #4   Title Pt will report being able to sit for 2 hours  at work without increased back pain.    Time 3   Period Weeks   Status On-going           PT Long Term Goals - 11/23/15 1540    PT LONG TERM GOAL #1   Title Pt will be independent with advanced HEP for core stabilization.   Time 6   Period Weeks   Status On-going   PT LONG TERM GOAL #2   Title Improve lumbar ROM to be full and painfree to allow for pt to bend and rotate to complete functional tasks.    Time 6   Period Weeks   Status On-going   PT LONG TERM GOAL #3   Title Improve BLE strength to 4+/5, with pt being able to complete five time sit to stand in 11 seconds or less.    Time 6   Period Weeks   Status Partially Met   PT LONG TERM GOAL #4   Title Pt will report working for 6 hours before onset of LBP to demonstrate improved functional activity tolerance.    Time 6   Period Weeks   Status On-going   PT LONG TERM GOAL #5   Title Pt will report being able to walk for a full hour on her lunch break without onset of LBP.    Time 6    Period Weeks   Status Achieved               Plan - 11/25/15 1617    Clinical Impression Statement Continued with core strengthening in today's treatment. Pt required more frequent rest breaks today due to stomach problems, but was able to complete all therex without pain. Weights were added to oblique side bending without any adverse response. Standing chop with tband was added today, pt required verbal and tactile cueing to complete with proper form. Pt reported some increased fatigue/soreness at end of session, but denied having any pain.    PT Next Visit Plan Add holds for quadruped arm/leg extension        Problem List There are no active problems to display for this patient.   Hilma Favors, PT, DPT [phone] 11/25/2015, 4:20 PM  [place], [place], [address] Phone: [phone]   Fax:  [phone]  Name: Latoya Espinoza MRN: [id number] Date of Birth: 11/25/1976

## 2015-11-29 ENCOUNTER — Ambulatory Visit (HOSPITAL_COMMUNITY): Payer: BLUE CROSS/BLUE SHIELD | Admitting: Physical Therapy

## 2015-11-30 ENCOUNTER — Encounter (HOSPITAL_COMMUNITY): Payer: BLUE CROSS/BLUE SHIELD | Admitting: Physical Therapy

## 2015-12-02 ENCOUNTER — Ambulatory Visit (HOSPITAL_COMMUNITY): Payer: BLUE CROSS/BLUE SHIELD | Admitting: Physical Therapy

## 2015-12-02 DIAGNOSIS — M545 Low back pain: Secondary | ICD-10-CM | POA: Diagnosis not present

## 2015-12-02 DIAGNOSIS — R293 Abnormal posture: Secondary | ICD-10-CM

## 2015-12-02 DIAGNOSIS — R198 Other specified symptoms and signs involving the digestive system and abdomen: Secondary | ICD-10-CM

## 2015-12-02 NOTE — Therapy (Signed)
[place], [place], [address] Phone: [phone]   Fax:  [phone]  Physical Therapy Treatment  Patient Details  Name: Latoya Espinoza MRN: [id number] Date of Birth: 1976-09-24 Referring Provider: Dr. Francesco Runner  Encounter Date: 12/02/2015      PT End of Session - 12/02/15 1610    Visit Number 12   Number of Visits 16   Date for PT Re-Evaluation 12/20/15   Authorization Type BCBS   Authorization Time Period 10/20/15-12/20/15   PT Start Time 1527   PT Stop Time 1602   PT Time Calculation (min) 35 min   Activity Tolerance Patient tolerated treatment well   Behavior During Therapy [facility] for tasks assessed/performed      No past medical history on file.  No past surgical history on file.  There were no vitals filed for this visit.  Visit Diagnosis:  Bilateral low back pain, unspecified chronicity, with sciatica presence unspecified  Abdominal weakness  Poor posture      Subjective Assessment - 12/02/15 1528    Subjective Pt reports that she had the day off of work yesterday and went shopping all day, so her back was really hurting last night. It's still bothering her a little bit today.    Currently in Pain? Yes   Pain Score 2    Pain Location Back                         [facility] Adult PT Treatment/Exercise - 12/02/15 0001    Lumbar Exercises: Stretches   Active Hamstring Stretch 3 reps;30 seconds   Active Hamstring Stretch Limitations 12 inch step   Single Knee to Chest Stretch 10 seconds;5 reps   Piriformis Stretch 3 reps;30 seconds   Lumbar Exercises: Standing   Other Standing Lumbar Exercises sidestepping with BTB x 2RT. monster walk with BTB x 2RT   Lumbar Exercises: Seated   Long Arc Quad on [place] 15 reps   Hip Flexion on [place] 15 reps   Lumbar Exercises: Supine   Bridge 10 reps   Bridge Limitations single leg bridge on swiss ball   Other Supine Lumbar Exercises tap downs  from tabletop position x 15 bilat   Lumbar Exercises: Quadruped   Opposite Arm/Leg Raise 15 reps   Opposite Arm/Leg Raise Limitations with green tband   Other Quadruped Lumbar Exercises fire hydrant x 15 bilat                  PT Short Term Goals - 11/23/15 1539    PT SHORT TERM GOAL #1   Title Pt will be independent with HEP.   Time 3   Period Weeks   Status Achieved   PT SHORT TERM GOAL #2   Title Increase lumbar flexion to 70 degrees or greater to allow pt to bend forward without pain.    Time 3   Period Weeks   Status Achieved   PT SHORT TERM GOAL #3   Title Improve BLE strength as evidenced by five time sit to stand time of 13 seconds or less.    Time 3   Period Weeks   Status Achieved   PT SHORT TERM GOAL #4   Title Pt will report being able to sit for 2 hours at work without increased back pain.    Time 3   Period Weeks   Status On-going  PT Long Term Goals - 11/23/15 1540    PT LONG TERM GOAL #1   Title Pt will be independent with advanced HEP for core stabilization.   Time 6   Period Weeks   Status On-going   PT LONG TERM GOAL #2   Title Improve lumbar ROM to be full and painfree to allow for pt to bend and rotate to complete functional tasks.    Time 6   Period Weeks   Status On-going   PT LONG TERM GOAL #3   Title Improve BLE strength to 4+/5, with pt being able to complete five time sit to stand in 11 seconds or less.    Time 6   Period Weeks   Status Partially Met   PT LONG TERM GOAL #4   Title Pt will report working for 6 hours before onset of LBP to demonstrate improved functional activity tolerance.    Time 6   Period Weeks   Status On-going   PT LONG TERM GOAL #5   Title Pt will report being able to walk for a full hour on her lunch break without onset of LBP.    Time 6   Period Weeks   Status Achieved               Plan - 12/02/15 1611    Clinical Impression Statement Pt 12 minutes late today due to being  held up at work. Progressed quadruped arm/leg raise with addition of theraband to improve core strength and control, pt required verbal and tactile cueing for proper form, but was able to complete without pain. Pt continues to demonstrate functional gains towards her LTGs, and will be seen for one more session to update HEP and discharge to continue independently.    PT Next Visit Plan Reassess and d/c if appropriate        Problem List There are no active problems to display for this patient.   Hilma Favors, PT, DPT [phone] 12/02/2015, 4:13 PM  [place] [address], [place], [address] Phone: [phone]   Fax:  [phone]  Name: Latoya Espinoza MRN: [id number] Date of Birth: [birth year]

## 2015-12-09 ENCOUNTER — Ambulatory Visit (HOSPITAL_COMMUNITY): Payer: BLUE CROSS/BLUE SHIELD | Admitting: Physical Therapy

## 2015-12-09 DIAGNOSIS — M545 Low back pain: Secondary | ICD-10-CM

## 2015-12-09 DIAGNOSIS — R293 Abnormal posture: Secondary | ICD-10-CM

## 2015-12-09 DIAGNOSIS — M2560 Stiffness of unspecified joint, not elsewhere classified: Secondary | ICD-10-CM

## 2015-12-09 DIAGNOSIS — R29898 Other symptoms and signs involving the musculoskeletal system: Secondary | ICD-10-CM

## 2015-12-09 DIAGNOSIS — R198 Other specified symptoms and signs involving the digestive system and abdomen: Secondary | ICD-10-CM

## 2015-12-09 NOTE — Therapy (Signed)
[place], [place], [address] Phone: [phone]   Fax:  [phone]  Physical Therapy Treatment  Patient Details  Name: Latoya Espinoza MRN: [id number] Date of Birth: July 11, 1976 Referring Provider: Dr. Francesco Runner  Encounter Date: 12/09/2015      PT End of Session - 12/09/15 1624    Visit Number 13   Number of Visits 16   Date for PT Re-Evaluation 12/20/15   Authorization Type [employer]   Authorization Time Period 10/20/15-12/20/15   PT Start Time 1430   PT Stop Time 1515   PT Time Calculation (min) 45 min   Activity Tolerance Patient tolerated treatment well   Behavior During Therapy [facility] for tasks assessed/performed      No past medical history on file.  No past surgical history on file.  There were no vitals filed for this visit.  Visit Diagnosis:  Bilateral low back pain, unspecified chronicity, with sciatica presence unspecified  Abdominal weakness  Poor posture  Weakness of both legs  Joint stiffness of spine      Subjective Assessment - 12/09/15 1440    Subjective Pt reports that her back has continued to feel better. She has had a few rough days recently, but she thinks it may be from the cold.    How long can you sit comfortably? majority of her work day with short standing breaks   How long can you stand comfortably? 2 hours   How long can you walk comfortably? 2 hours   Currently in Pain? Yes   Pain Score 2    Pain Location Back   Pain Orientation Lower            [facility] PT Assessment - 12/09/15 0001    Observation/Other Assessments   Focus on Therapeutic Outcomes (FOTO)  47% limited   Strength   Right Hip Flexion 4+/5   Right Hip Extension 4/5   Right Hip ABduction 4+/5   Left Hip Flexion 4+/5   Left Hip Extension 4/5   Left Hip ABduction 4+/5   Right Knee Flexion 4+/5   Right Knee Extension 5/5   Left Knee Flexion 4+/5   Left Knee Extension 5/5                              PT Education - 12/09/15 1624    Education provided Yes   Education Details updated HEP   Person(s) Educated Patient   Methods Explanation;Handout   Comprehension Verbalized understanding;Returned demonstration          PT Short Term Goals - 12/09/15 1622    PT SHORT TERM GOAL #1   Title Pt will be independent with HEP.   Time 3   Period Weeks   Status Achieved   PT SHORT TERM GOAL #2   Title Increase lumbar flexion to 70 degrees or greater to allow pt to bend forward without pain.    Time 3   Period Weeks   Status Achieved   PT SHORT TERM GOAL #3   Title Improve BLE strength as evidenced by five time sit to stand time of 13 seconds or less.    Time 3   Period Weeks   Status Achieved   PT SHORT TERM GOAL #4   Title Pt will report being able to sit for 2 hours at work without increased back pain.    Time 3   Period Weeks  Status Achieved           PT Long Term Goals - 12/09/15 1622    PT LONG TERM GOAL #1   Title Pt will be independent with advanced HEP for core stabilization.   Time 6   Period Weeks   Status Achieved   PT LONG TERM GOAL #2   Title Improve lumbar ROM to be full and painfree to allow for pt to bend and rotate to complete functional tasks.    Time 6   Period Weeks   Status Achieved   PT LONG TERM GOAL #3   Title Improve BLE strength to 4+/5, with pt being able to complete five time sit to stand in 11 seconds or less.    Time 6   Period Weeks   Status Partially Met   PT LONG TERM GOAL #4   Title Pt will report working for 6 hours before onset of LBP to demonstrate improved functional activity tolerance.    Time 6   Period Weeks   Status Achieved   PT LONG TERM GOAL #5   Title Pt will report being able to walk for a full hour on her lunch break without onset of LBP.    Time 6   Period Weeks   Status Achieved               Plan - 12/09/15 1625    Clinical Impression Statement  Reassessment completed today. Pt has made excellent progress with PT treatment. She reports that she is now able to work a full day with little to no low back pain, is compliant with her HEP, and has met her LTG's. Pt demonstrates improvements in core strength, BLE strength, and functional activity tolerance. She shows excellent compliance, and is being discharged to continue independently with her HEP.    PT Treatment/Interventions ADLs/Self Care Home Management;Moist Heat;Traction;Gait training;Stair training;Functional mobility training;Therapeutic activities;Therapeutic exercise;Neuromuscular re-education;Patient/family education;Manual techniques   PT Next Visit Plan discharged to HEP        Problem List There are no active problems to display for this patient.   PHYSICAL THERAPY DISCHARGE SUMMARY  Visits from Start of Care: 13  Current functional level related to goals / functional outcomes: Pt has made excellent progress with PT, she demonstrates improved core and BLE strength, lumbar ROM, and functional activity tolerance. She has met all LTGs.    Remaining deficits: None at this time   Education / Equipment: HEP  Plan: Patient agrees to discharge.  Patient goals were met. Patient is being discharged due to meeting the stated rehab goals.  ?????       Hilma Favors, PT, DPT [phone] 12/09/2015, 4:27 PM  [place], [place], [address] Phone: [phone]   Fax:  [phone]  Name: Latoya Espinoza MRN: [id number] Date of Birth: [birth year]

## 2015-12-09 NOTE — Patient Instructions (Signed)
DEAD BUG  While lying on your back with your knees and hips bent to 90 degrees, use your stomach muscles and maintain pelvic neutral position. Do not allow your spine to move.   Hold pelvic neutral and then slowly straighten out a leg without touching the floor.  At the same time raise an opposite arm over head. Do not allow your spine to arch during this movement.  Retrun to starting position and then repeat on the opposite side.    QUADRUPED ALTERNATE ARM AND LEG "BIRD DOG"  While in a crawling position, slowly draw your leg and opposite arm upwards.    Your arm and leg should be straight and fully out-stretched FIRE HYDRANT - QUADRUPED HIP ABDUCTION  Start in a crawl position and raise your leg out to the side as shown. Maintain a straight upper and mid back.   Pallof Punch  Stand with your right side facing the weight stack with your feet about shoulder-width apart, knees slightly bent. Step away from the stack so the cable is taut, hold the handle against your chest and brace your abs. Slowly press your arms in front of you until they're completely straight, pause for a second, and bring them back.  Prevent trunk rotation.

## 2016-01-26 ENCOUNTER — Telehealth (HOSPITAL_COMMUNITY): Payer: Self-pay | Admitting: Physical Therapy

## 2016-01-26 NOTE — Telephone Encounter (Signed)
Sent Dr. Cornell Barman this patients records by FAX [phone] at doctors request. NF 01/26/16

## 2016-02-02 ENCOUNTER — Encounter: Payer: BLUE CROSS/BLUE SHIELD | Attending: Family Medicine | Admitting: Nutrition

## 2016-02-02 ENCOUNTER — Encounter: Payer: Self-pay | Admitting: Nutrition

## 2016-02-02 VITALS — Ht 65.0 in | Wt 262.0 lb

## 2016-02-02 DIAGNOSIS — R739 Hyperglycemia, unspecified: Secondary | ICD-10-CM

## 2016-02-02 DIAGNOSIS — E663 Overweight: Secondary | ICD-10-CM | POA: Insufficient documentation

## 2016-02-02 NOTE — Patient Instructions (Signed)
Goals 1. Follow My Plate Method 2. Cut out fried foods. 3.  Exercise 30-60 minutes 5 days a week. 4. Lose 1-2 lbs per week. 5. Cut out sweet tea.

## 2016-02-02 NOTE — Progress Notes (Signed)
  Medical Nutrition Therapy:  Appt start time: 1600 end time:  1700.   Assessment:  Primary concerns today: Prediabetes and obesity. A1C 6.3%. She lives with her husband and 2 kids. Had gestational diabetes with he youngest son who is 41 yrs old now. This is the most she has ever weighed. She wants to lose weight. She does all the cooking and shopping.. Most foods are grilled and a lot are fried. She eats out daily for lunch and 1-2 times a week for dinner. She has started walking 1-2 miles during lunch at work. Going to join the [employer] to use their equipment for exercising along with back therapy in the pool. She drinks mostly sweet tea. Doesn't drink sodas anymore. Diet is excessive in calories, fat, sodium and sugar/carbs.    Preferred Learning Style:   No preference indicated   Learning Readiness:   Ready  Change in progress   MEDICATIONS: See list   DIETARY INTAKE:  24-hr recall:  B ( AM): Skips usually or boiled egg and toast or honey nut cherrios with 2% milk  Snk ( AM): nabs or chips, sweet tea  L ( PM): Eats out: [place], Petes or Mayberries., Sweet tea Snk ( PM): chips, oatmeal cookie or misc, water D ( PM):  BBQ, and hush puppies, Sweet tea Snk ( PM): Misc. Beverages: Sweet Tea, some water  Usual physical activity: Walks a little  Estimated energy needs: 1500 calories 170 g carbohydrates 112 g protein 42 g fat  Progress Towards Goal(s):  In progress.   Nutritional Diagnosis:  NB-1.1 Food and nutrition-related knowledge deficit As related to Prediabetes and obesity.  As evidenced by A1C 6.3% and BMI > 40..    Intervention:  Nutrition and Diabetes education provided on My Plate, CHO counting, meal planning, portion sizes, timing of meals, avoiding snacks between meals unless having a low blood sugar, target ranges for A1C and blood sugars, signs/symptoms and treatment of hyper/hypoglycemia, monitoring blood sugars, taking medications as prescribed, benefits of  exercising 30 minutes per day and prevention of complications of DM. Low Sodium Low fat high fiber diet..   Goals 1. Follow My Plate Method 2. Cut out fried foods. 3.  Exercise 30-60 minutes 5 days a week. 4. Lose 1-2 lbs per week. 5. Cut out sweet tea.   Teaching Method Utilized:  Visual Auditory Hands on  Handouts given during visit include:  The Plate Method  Meal Plan Card  Diabetes Instructions.   Barriers to learning/adherence to lifestyle change: None  Demonstrated degree of understanding via:  Teach Back   Monitoring/Evaluation:  Dietary intake, exercise, meal planning, SBG, and body weight in 1 month(s).

## 2016-03-10 ENCOUNTER — Encounter: Payer: BLUE CROSS/BLUE SHIELD | Attending: Family Medicine | Admitting: Nutrition

## 2016-03-10 ENCOUNTER — Encounter: Payer: Self-pay | Admitting: Nutrition

## 2016-03-10 VITALS — Ht 65.0 in | Wt 249.8 lb

## 2016-03-10 DIAGNOSIS — E663 Overweight: Secondary | ICD-10-CM | POA: Insufficient documentation

## 2016-03-10 DIAGNOSIS — R739 Hyperglycemia, unspecified: Secondary | ICD-10-CM

## 2016-03-10 NOTE — Progress Notes (Signed)
  Medical Nutrition Therapy:  Appt start time: 1600 end time:  1700.   Assessment:  Primary concerns today: Prediabetes and obesity. Follow up  A1C 6.3%. CHanges made: She Has quit drinking tea. She has lost13 lbs since last month. She feels better. Able to move easier and less pain and breathing issues. She is grilling more foods and avoiding fast foods and junk food..    Preferred Learning Style:   No preference indicated   Learning Readiness:   Ready  Change in progress   MEDICATIONS: See list   DIETARY INTAKE:  24-hr recall:  B ( AM):  Egg, yogurt and toast, water,  Snk ( AM):  L ( PM): [place] sandwich or wheat wraps, salad mix, water, and fruit Snk ( PM):  D ( PM):  Chicken, green beans, carrots, fruit, Snk ( PM):  Beverages: water,  Usual physical activity: Walks a little  Estimated energy needs: 1500 calories 170 g carbohydrates 112 g protein 42 g fat  Progress Towards Goal(s):  In progress.   Nutritional Diagnosis:  NB-1.1 Food and nutrition-related knowledge deficit As related to Prediabetes and obesity.  As evidenced by A1C 6.3% and BMI > 40..    Intervention:  Nutrition and Diabetes education provided on My Plate, CHO counting, meal planning, portion sizes, timing of meals, avoiding snacks between meals unless having a low blood sugar, target ranges for A1C and blood sugars, signs/symptoms and treatment of hyper/hypoglycemia, monitoring blood sugars, taking medications as prescribed, benefits of exercising 30 minutes per day and prevention of complications of DM. Low Sodium Low fat high fiber diet..   Goals 1. Follow My Plate Method 2. Keep losing 1-2 lbs per week, 3. Continue to exercise  daily.   Teaching Method Utilized:  Visual Auditory Hands on  Handouts given during visit include:  The Plate Method  Meal Plan Card  Diabetes Instructions.   Barriers to learning/adherence to lifestyle change: None  Demonstrated degree of understanding  via:  Teach Back   Monitoring/Evaluation:  Dietary intake, exercise, meal planning, SBG, and body weight in 1 month(s).

## 2016-03-10 NOTE — Patient Instructions (Signed)
Goals 1. Follow My Plate Method 2. Keep losing 1-2 lbs per week, 3. Continue to exercise  Daily. 4. Read food labels.

## 2016-03-20 ENCOUNTER — Encounter: Payer: Self-pay | Admitting: Nutrition

## 2016-04-21 ENCOUNTER — Encounter: Payer: BLUE CROSS/BLUE SHIELD | Attending: Family Medicine | Admitting: Nutrition

## 2016-04-21 ENCOUNTER — Encounter: Payer: Self-pay | Admitting: Nutrition

## 2016-04-21 VITALS — Ht 65.0 in | Wt 242.4 lb

## 2016-04-21 DIAGNOSIS — R739 Hyperglycemia, unspecified: Secondary | ICD-10-CM

## 2016-04-21 DIAGNOSIS — E663 Overweight: Secondary | ICD-10-CM | POA: Diagnosis present

## 2016-04-21 NOTE — Patient Instructions (Signed)
Goals 1. Try to consider flax seed meal or chia seeds to CIB if wanted to try 2. Start drumming exercises 3. Keep walking 4-5 days per week. 4. Keep 1-2 lbs  Per week.

## 2016-04-21 NOTE — Progress Notes (Signed)
  Medical Nutrition Therapy:  Appt start time: 1600 end time:  1630   Assessment:  Primary concerns today: Prediabetes and obesity. Follow up  A1C 6.3%  Has lost 7 lbs. Has been buying more fresh fruits, Bowel habits much better.  . CHanges made: She Has quit drinking tea. She has lost13 lbs since last month. She feels better. Able to move easier and less pain and breathing issues. She is grilling more foods and avoiding fast foods and junk food..    Preferred Learning Style:   No preference indicated   Learning Readiness:   Ready  Change in progress   MEDICATIONS: See list   DIETARY INTAKE:  24-hr recall:  B ( AM):  Egg, yogurt and toast, water,   Snk ( AM):  L ( PM): Vegetables raw and [place] sandwich; water Snk ( PM):  D ( PM):  Grilled chicken, mushrooms, flat beans, water Snk ( PM):  Beverages: water,  Usual physical activity:  Walk for an hour daily. For 2 1/2 hr.  Estimated energy needs: 1500 calories 170 g carbohydrates 112 g protein 42 g fat  Progress Towards Goal(s):  In progress.   Nutritional Diagnosis:  NB-1.1 Food and nutrition-related knowledge deficit As related to Prediabetes and obesity.  As evidenced by A1C 6.3% and BMI > 40..    Intervention:  Nutrition and Diabetes education provided on My Plate, CHO counting, meal planning, portion sizes, timing of meals, avoiding snacks between meals unless having a low blood sugar, target ranges for A1C and blood sugars, signs/symptoms and treatment of hyper/hypoglycemia, monitoring blood sugars, taking medications as prescribed, benefits of exercising 30 minutes per day and prevention of complications of DM. Low Sodium Low fat high fiber diet..   Goals 1. Try to consider flax seed meal or chia seeds to CIB if wanted to try 2. Start drumming exercises 3. Keep walking 4-5 days per week. 4. Keep 1-2 lbs  Per week.   Teaching Method Utilized:  Visual Auditory Hands on  Handouts given during visit  include:  The Plate Method  Meal Plan Card  Diabetes Instructions.   Barriers to learning/adherence to lifestyle change: None  Demonstrated degree of understanding via:  Teach Back   Monitoring/Evaluation:  Dietary intake, exercise, meal planning, SBG, and body weight in 1 month(s).

## 2016-06-10 ENCOUNTER — Emergency Department (HOSPITAL_COMMUNITY): Payer: BLUE CROSS/BLUE SHIELD

## 2016-06-10 ENCOUNTER — Encounter (HOSPITAL_COMMUNITY): Payer: Self-pay | Admitting: Emergency Medicine

## 2016-06-10 ENCOUNTER — Emergency Department (HOSPITAL_COMMUNITY)
Admission: EM | Admit: 2016-06-10 | Discharge: 2016-06-10 | Disposition: A | Discharge status: Home / Self Care | Payer: BLUE CROSS/BLUE SHIELD | Attending: Emergency Medicine | Admitting: Emergency Medicine

## 2016-06-10 DIAGNOSIS — N200 Calculus of kidney: Secondary | ICD-10-CM | POA: Insufficient documentation

## 2016-06-10 DIAGNOSIS — N23 Unspecified renal colic: Secondary | ICD-10-CM | POA: Diagnosis not present

## 2016-06-10 DIAGNOSIS — E669 Obesity, unspecified: Secondary | ICD-10-CM | POA: Diagnosis not present

## 2016-06-10 DIAGNOSIS — R109 Unspecified abdominal pain: Secondary | ICD-10-CM | POA: Diagnosis present

## 2016-06-10 DIAGNOSIS — Z6838 Body mass index (BMI) 38.0-38.9, adult: Secondary | ICD-10-CM | POA: Diagnosis not present

## 2016-06-10 DIAGNOSIS — M1990 Unspecified osteoarthritis, unspecified site: Secondary | ICD-10-CM | POA: Diagnosis not present

## 2016-06-10 HISTORY — DX: Unspecified osteoarthritis, unspecified site: M19.90

## 2016-06-10 LAB — URINALYSIS, ROUTINE W REFLEX MICROSCOPIC
Bilirubin Urine: NEGATIVE
GLUCOSE, UA: NEGATIVE mg/dL
Ketones, ur: NEGATIVE mg/dL
LEUKOCYTES UA: NEGATIVE
Nitrite: NEGATIVE
PROTEIN: 30 mg/dL — AB
SPECIFIC GRAVITY, URINE: 1.025 (ref 1.005–1.030)
pH: 6 (ref 5.0–8.0)

## 2016-06-10 LAB — URINE MICROSCOPIC-ADD ON

## 2016-06-10 LAB — POC URINE PREG, ED: PREG TEST UR: NEGATIVE

## 2016-06-10 MED ORDER — MELOXICAM 15 MG PO TABS: 15.0000 mg | ORAL_TABLET | Freq: Every day | ORAL | Status: DC

## 2016-06-10 MED ORDER — KETOROLAC TROMETHAMINE 30 MG/ML IJ SOLN
30.0000 mg | Freq: Once | INTRAMUSCULAR | Status: AC
  Administered 2016-06-10: 30 mg via INTRAVENOUS
  Filled 2016-06-10: qty 1

## 2016-06-10 MED ORDER — DIPHENHYDRAMINE HCL 50 MG/ML IJ SOLN
12.5000 mg | Freq: Once | INTRAMUSCULAR | Status: AC
  Administered 2016-06-10: 12.5 mg via INTRAVENOUS
  Filled 2016-06-10: qty 1

## 2016-06-10 MED ORDER — PROMETHAZINE HCL 12.5 MG PO TABS
25.0000 mg | ORAL_TABLET | Freq: Once | ORAL | Status: AC
  Administered 2016-06-10: 25 mg via ORAL
  Filled 2016-06-10: qty 2

## 2016-06-10 MED ORDER — FENTANYL CITRATE (PF) 100 MCG/2ML IJ SOLN
50.0000 ug | Freq: Once | INTRAMUSCULAR | Status: AC
  Administered 2016-06-10: 50 ug via INTRAVENOUS

## 2016-06-10 MED ORDER — HYDROMORPHONE HCL 1 MG/ML IJ SOLN
0.5000 mg | Freq: Once | INTRAMUSCULAR | Status: AC
  Administered 2016-06-10: 0.5 mg via INTRAVENOUS
  Filled 2016-06-10: qty 1

## 2016-06-10 MED ORDER — FENTANYL CITRATE (PF) 100 MCG/2ML IJ SOLN
100.0000 ug | Freq: Once | INTRAMUSCULAR | Status: DC
  Administered 2016-06-10: 100 ug via INTRAMUSCULAR
  Filled 2016-06-10: qty 2

## 2016-06-10 MED ORDER — OXYCODONE-ACETAMINOPHEN 5-325 MG PO TABS: 1.0000 | ORAL_TABLET | Freq: Four times a day (QID) | ORAL | Status: DC | PRN

## 2016-06-10 MED ORDER — FENTANYL CITRATE (PF) 100 MCG/2ML IJ SOLN
50.0000 ug | Freq: Once | INTRAMUSCULAR | Status: AC
  Administered 2016-06-10: 50 ug via INTRAVENOUS
  Filled 2016-06-10: qty 2

## 2016-06-10 NOTE — ED Provider Notes (Signed)
CSN: [id number]     Arrival date & time 06/10/16  0840 History   First MD Initiated Contact with Patient 06/10/16 [phone]     Chief Complaint  Patient presents with  . Flank Pain     (Consider location/radiation/quality/duration/timing/severity/associated sxs/prior Treatment) HPI Comments: Patient is a 40 year old female who presents to the emergency department with a complaint of flank pain on the right.  The patient states that on yesterday she had some mild discomfort. She noticed a small amount of blood on yesterday in the urine on. She thought that maybe she was starting her menstrual cycle . She denies pain with urination. There's been no fever or chills reported. The patient had some mild nausea, but no other issues. His been flank pain noted but no back pain reported. Patient describes the pain as moving from the right flank to the right lower abdomen. Pain is getting progressively worse.  The history is provided by the patient.    Past Medical History  Diagnosis Date  . GERD (gastroesophageal reflux disease)   . Obesity   . Arthritis    Past Surgical History  Procedure Laterality Date  . Cholecystectomy     History reviewed. No pertinent family history. Social History  Substance Use Topics  . Smoking status: Never Smoker   . Smokeless tobacco: Never Used  . Alcohol Use: No   OB History    Gravida Para Term Preterm AB TAB SAB Ectopic Multiple Living   Review of Systems  Constitutional: Negative for fever and chills.  Gastrointestinal: Negative for vomiting and diarrhea.  Genitourinary: Positive for flank pain. Negative for dysuria.  All other systems reviewed and are negative.     Allergies  Dilaudid  Home Medications   Prior to Admission medications   Medication Sig Start Date End Date Taking? Authorizing Provider  omeprazole (PRILOSEC) 40 MG capsule Take 40 mg by mouth daily.    Historical Provider, MD  traMADol (ULTRAM) 50 MG tablet  Take by mouth every 6 (six) hours as needed.    Historical Provider, MD  Vitamin D, Ergocalciferol, (DRISDOL) 50000 units CAPS capsule Take 50,000 Units by mouth every 7 (seven) days.    Historical Provider, MD   BP 134/73 mmHg  Pulse 67  Temp(Src) 98.2 F (36.8 C) (Oral)  Resp 18  Ht  (1.651 m)  Wt 106.142 kg  BMI 38.94 kg/m2  SpO2 100%  LMP 05/09/2016 Physical Exam  Constitutional: She is oriented to person, place, and time. She appears well-developed and well-nourished.  Non-toxic appearance.  HENT:  Head: Normocephalic.  Right Ear: Tympanic membrane and external ear normal.  Left Ear: Tympanic membrane and external ear normal.  Eyes: EOM and lids are normal. Pupils are equal, round, and reactive to light.  Neck: Normal range of motion. Neck supple. Carotid bruit is not present.  Cardiovascular: Normal rate, regular rhythm, normal heart sounds, intact distal pulses and normal pulses.   Pulmonary/Chest: Breath sounds normal. No respiratory distress.  Abdominal: Soft. Bowel sounds are normal. There is tenderness.  Right CVAT. Pain to palpation of right flank area. No significant pain at McBurney's point.  Musculoskeletal: Normal range of motion.  Lymphadenopathy:       Head (right side): No submandibular adenopathy present.       Head (left side): No submandibular adenopathy present.    She has no cervical adenopathy.  Neurological: She is alert and oriented  to person, place, and time. She has normal strength. No cranial nerve deficit or sensory deficit.  Skin: Skin is warm and dry.  Psychiatric: She has a normal mood and affect. Her speech is normal.  Nursing note and vitals reviewed.   ED Course  Procedures (including critical care time) Labs Review Labs Reviewed  URINALYSIS, ROUTINE W REFLEX MICROSCOPIC (NOT AT [facility])  POC URINE PREG, ED    Imaging Review No results found. I have personally reviewed and evaluated these images and lab results as part of my medical  decision-making.   EKG Interpretation None      MDM Vital signs are well within normal limits. Urinalysis reveals a cloudy yellow specimen with a specific gravity 1.025. There is a large hemoglobin present. The nitrates and the leukocyte esterase are negative. Urine pregnancy is negative. Only minimal relief from IV fentanyl. Will use IV dilaudid cautiously with benadryl. No new symptoms on recheck exam.  Patient reports some improvement after the Dilaudid. CT scan for kidney stone reveals a 5 mm proximal right ureteral on stone associated with a mild right hydronephrosis.  Patient is advised to strain all urine. Increase fluids. Prescription for mobile in Percocet given to the patient. Patient is referred to urology. Patient is to return to the emergency department sooner if any of problems with excessive nausea, vomiting, back pain, fever, chills, or changes in general condition.    Final diagnoses:  None    *I have reviewed nursing notes, vital signs, and all appropriate lab and imaging results for this patient.**    Ivery QualeHobson Branon Sabine, PA-C 06/11/16 2153  Donnetta HutchingBrian Cook, MD 06/12/16 [phone]

## 2016-06-10 NOTE — ED Notes (Signed)
Patient c/o right flank pain that radiates into right lower abd. Per patient nausea but no vomiting, diarrhea, or fevers. Denies any pain with urination. Patient states "I saw blood yesterday in urine but thought it was my period because it is that time." Denies taking any medication for pain. No hx of kidney stones.

## 2016-06-10 NOTE — ED Notes (Signed)
Ivery QualeHobson Bryant PA notified pain remains unchanged. Verbal order to adm fentanyl 50mcg IV now

## 2016-06-10 NOTE — Discharge Instructions (Signed)
Your urine test and your CT scan are diagnostic of a right side kidney stone. Please strain all urine. Effusion past the stone, please presented to the urology specialist listed above. Please call [facility] to schedule an appointment concerning your kidneys and your recent kidney stone episode. Please use meloxicam daily with food until all taken. Please use Percocet for pain. Percocet may cause drowsiness, and/or constipation. Please use a stool softener while taking this medication. Please do not drink alcohol, drive a vehicle, operating machinery, or participated in activities requiring concentration when taking this medication. Renal Colic Renal colic is pain that is caused by passing a kidney stone. The pain can be sharp and severe. It may be felt in the back, abdomen, side (flank), or groin. It can cause nausea. Renal colic can come and go. HOME CARE INSTRUCTIONS Watch your condition for any changes. The following actions may help to lessen any discomfort that you are feeling:  Take medicines only as directed by your health care provider.  Ask your health care provider if it is okay to take over-the-counter pain medicine.  Drink enough fluid to keep your urine clear or pale yellow. Drink 6-8 glasses of water each day.  Limit the amount of salt that you eat to less than 2 grams per day.  Reduce the amount of protein in your diet. Eat less meat, fish, nuts, and dairy.  Avoid foods such as spinach, rhubarb, nuts, or bran. These may make kidney stones more likely to form. SEEK MEDICAL CARE IF:  You have a fever or chills.  Your urine smells bad or looks cloudy.  You have pain or burning when you pass urine. SEEK IMMEDIATE MEDICAL CARE IF:  Your flank pain or groin pain suddenly worsens.  You become confused or disoriented or you lose consciousness.   This information is not intended to replace advice given to you by your health care provider. Make sure you discuss any questions  you have with your health care provider.   Document Released: 09/20/2005 Document Revised: 01/01/2015 Document Reviewed: 10/21/2014 Elsevier Interactive Patient Education 2016 Else[employer] Inc[employer]  Kidney Stones Kidney stones (urolithiasis) are deposits that form inside your kidneys. The intense pain is caused by the stone moving through the urinary tract. When the stone moves, the ureter goes into spasm around the stone. The stone is usually passed in the urine.  CAUSES   A disorder that makes certain neck glands produce too much parathyroid hormone (primary hyperparathyroidism).  A buildup of uric acid crystals, similar to gout in your joints.  Narrowing (stricture) of the ureter.  A kidney obstruction present at birth (congenital obstruction).  Previous surgery on the kidney or ureters.  Numerous kidney infections. SYMPTOMS   Feeling sick to your stomach (nauseous).  Throwing up (vomiting).  Blood in the urine (hematuria).  Pain that usually spreads (radiates) to the groin.  Frequency or urgency of urination. DIAGNOSIS   Taking a history and physical exam.  Blood or urine tests.  CT scan.  Occasionally, an examination of the inside of the urinary bladder (cystoscopy) is performed. TREATMENT   Observation.  Increasing your fluid intake.  Extracorporeal shock wave lithotripsy--This is a noninvasive procedure that uses shock waves to break up kidney stones.  Surgery may be needed if you have severe pain or persistent obstruction. There are various surgical procedures. Most of the procedures are performed with the use of small instruments. Only small incisions are needed to accommodate these instruments, so recovery time is  minimized. The size, location, and chemical composition are all important variables that will determine the proper choice of action for you. Talk to your health care provider to better understand your situation so that you will minimize the risk of injury  to yourself and your kidney.  HOME CARE INSTRUCTIONS   Drink enough water and fluids to keep your urine clear or pale yellow. This will help you to pass the stone or stone fragments.  Strain all urine through the provided strainer. Keep all particulate matter and stones for your health care provider to see. The stone causing the pain may be as small as a grain of salt. It is very important to use the strainer each and every time you pass your urine. The collection of your stone will allow your health care provider to analyze it and verify that a stone has actually passed. The stone analysis will often identify what you can do to reduce the incidence of recurrences.  Only take over-the-counter or prescription medicines for pain, discomfort, or fever as directed by your health care provider.  Keep all follow-up visits as told by your health care provider. This is important.  Get follow-up X-rays if required. The absence of pain does not always mean that the stone has passed. It may have only stopped moving. If the urine remains completely obstructed, it can cause loss of kidney function or even complete destruction of the kidney. It is your responsibility to make sure X-rays and follow-ups are completed. Ultrasounds of the kidney can show blockages and the status of the kidney. Ultrasounds are not associated with any radiation and can be performed easily in a matter of minutes.  Make changes to your daily diet as told by your health care provider. You may be told to:  Limit the amount of salt that you eat.  Eat 5 or more servings of fruits and vegetables each day.  Limit the amount of meat, poultry, fish, and eggs that you eat.  Collect a 24-hour urine sample as told by your health care provider.You may need to collect another urine sample every 6-12 months. SEEK MEDICAL CARE IF:  You experience pain that is progressive and unresponsive to any pain medicine you have been prescribed. SEEK  IMMEDIATE MEDICAL CARE IF:   Pain cannot be controlled with the prescribed medicine.  You have a fever or shaking chills.  The severity or intensity of pain increases over 18 hours and is not relieved by pain medicine.  You develop a new onset of abdominal pain.  You feel faint or pass out.  You are unable to urinate.   This information is not intended to replace advice given to you by your health care provider. Make sure you discuss any questions you have with your health care provider.   Document Released: 12/11/2005 Document Revised: 09/01/2015 Document Reviewed: 05/14/2013 Elsevier Interactive Patient Education [employer]

## 2016-06-11 ENCOUNTER — Emergency Department (HOSPITAL_COMMUNITY)
Admission: EM | Admit: 2016-06-11 | Discharge: 2016-06-11 | Disposition: A | Discharge status: Home / Self Care | Payer: BLUE CROSS/BLUE SHIELD | Attending: Emergency Medicine | Admitting: Emergency Medicine

## 2016-06-11 ENCOUNTER — Encounter (HOSPITAL_COMMUNITY): Payer: Self-pay | Admitting: *Deleted

## 2016-06-11 ENCOUNTER — Emergency Department (HOSPITAL_COMMUNITY): Payer: BLUE CROSS/BLUE SHIELD

## 2016-06-11 DIAGNOSIS — N201 Calculus of ureter: Secondary | ICD-10-CM

## 2016-06-11 DIAGNOSIS — M1990 Unspecified osteoarthritis, unspecified site: Secondary | ICD-10-CM | POA: Diagnosis not present

## 2016-06-11 DIAGNOSIS — R509 Fever, unspecified: Secondary | ICD-10-CM | POA: Diagnosis present

## 2016-06-11 DIAGNOSIS — R109 Unspecified abdominal pain: Secondary | ICD-10-CM | POA: Diagnosis not present

## 2016-06-11 DIAGNOSIS — Z791 Long term (current) use of non-steroidal anti-inflammatories (NSAID): Secondary | ICD-10-CM | POA: Diagnosis not present

## 2016-06-11 DIAGNOSIS — Z79899 Other long term (current) drug therapy: Secondary | ICD-10-CM | POA: Diagnosis not present

## 2016-06-11 LAB — BASIC METABOLIC PANEL
ANION GAP: 4 — AB (ref 5–15)
BUN: 12 mg/dL (ref 6–20)
CALCIUM: 8.3 mg/dL — AB (ref 8.9–10.3)
CO2: 24 mmol/L (ref 22–32)
CREATININE: 0.98 mg/dL (ref 0.44–1.00)
Chloride: 107 mmol/L (ref 101–111)
GFR calc Af Amer: >60 mL/min (ref >60)
GLUCOSE: 105 mg/dL — AB (ref 65–99)
Potassium: 3.6 mmol/L (ref 3.5–5.1)
Sodium: 135 mmol/L (ref 135–145)

## 2016-06-11 LAB — CBC WITH DIFFERENTIAL/PLATELET
BASOS ABS: 0 10*3/uL (ref 0.0–0.1)
BASOS PCT: 0 %
EOS ABS: 0.1 10*3/uL (ref 0.0–0.7)
EOS PCT: 1 %
HEMATOCRIT: 37.4 % (ref 36.0–46.0)
Hemoglobin: 12.6 g/dL (ref 12.0–15.0)
Lymphocytes Relative: 21 %
Lymphs Abs: 2.1 10*3/uL (ref 0.7–4.0)
MCH: 29.4 pg (ref 26.0–34.0)
MCHC: 33.7 g/dL (ref 30.0–36.0)
MCV: 87.4 fL (ref 78.0–100.0)
MONO ABS: 0.4 10*3/uL (ref 0.1–1.0)
MONOS PCT: 4 %
NEUTROS ABS: 7.4 10*3/uL (ref 1.7–7.7)
Neutrophils Relative %: 74 %
PLATELETS: 229 10*3/uL (ref 150–400)
RBC: 4.28 MIL/uL (ref 3.87–5.11)
RDW: 13.1 % (ref 11.5–15.5)
WBC: 10.1 10*3/uL (ref 4.0–10.5)

## 2016-06-11 LAB — URINALYSIS, ROUTINE W REFLEX MICROSCOPIC
BILIRUBIN URINE: NEGATIVE
GLUCOSE, UA: NEGATIVE mg/dL
Ketones, ur: NEGATIVE mg/dL
LEUKOCYTES UA: NEGATIVE
NITRITE: NEGATIVE
PH: 6 (ref 5.0–8.0)
Protein, ur: NEGATIVE mg/dL
SPECIFIC GRAVITY, URINE: 1.02 (ref 1.005–1.030)

## 2016-06-11 LAB — URINE MICROSCOPIC-ADD ON

## 2016-06-11 LAB — LACTIC ACID, PLASMA: LACTIC ACID, VENOUS: 1.3 mmol/L (ref 0.5–2.0)

## 2016-06-11 MED ORDER — DEXTROSE 5 % IV SOLN
1.0000 g | INTRAVENOUS | Status: DC
  Administered 2016-06-11: 1 g via INTRAVENOUS
  Filled 2016-06-11: qty 10

## 2016-06-11 MED ORDER — CIPROFLOXACIN HCL 500 MG PO TABS: 500.0000 mg | ORAL_TABLET | Freq: Two times a day (BID) | ORAL | Status: DC

## 2016-06-11 MED ORDER — SODIUM CHLORIDE 0.9 % IV BOLUS (SEPSIS)
1000.0000 mL | Freq: Once | INTRAVENOUS | Status: AC
  Administered 2016-06-11: 1000 mL via INTRAVENOUS

## 2016-06-11 MED ORDER — ACETAMINOPHEN 325 MG PO TABS
650.0000 mg | ORAL_TABLET | Freq: Once | ORAL | Status: AC
  Administered 2016-06-11: 650 mg via ORAL
  Filled 2016-06-11: qty 2

## 2016-06-11 NOTE — ED Notes (Signed)
Pt states she was seen here yesterday for a kidney stone and today she has been running a fever all day with body aches

## 2016-06-11 NOTE — ED Provider Notes (Signed)
CSN: [id number]     Arrival date & time 06/11/16  1904 History   First MD Initiated Contact with Patient 06/11/16 1939     Chief Complaint  Patient presents with  . Fever     (Consider location/radiation/quality/duration/timing/severity/associated sxs/prior Treatment) HPI 40 year old female presents with a fever since this morning. Yesterday was diagnosed with a first-time kidney stone. She developed right flank pain yesterday morning. Seen here and diagnosed with a 5 mm proximal ureteral stone. Has been taking low back and oxycodone. However she has developed a headache since then and then developed fevers, chills, and muscle aches. Has had the symptoms all day. Called the ER and was told to come back for a possibly infected stone. Patient's temperature was 101.5 before she came in. Did not take any medicine. No vomiting but has felt nauseated. Pain has moved and is also in her right side and right lower abdomen. Patient is on a diuretic and frequently urinates. Has not taken last few days. But is peeing less, only 2-3 times per hour. Urinates small amounts at a time. No dysuria.  Past Medical History  Diagnosis Date  . GERD (gastroesophageal reflux disease)   . Obesity   . Arthritis    Past Surgical History  Procedure Laterality Date  . Cholecystectomy     History reviewed. No pertinent family history. Social History  Substance Use Topics  . Smoking status: Never Smoker   . Smokeless tobacco: Never Used  . Alcohol Use: No   OB History    Gravida Para Term Preterm AB TAB SAB Ectopic Multiple Living   2 2 2       2      Review of Systems  Constitutional: Positive for fever and chills.  Respiratory: Negative for cough and shortness of breath.   Gastrointestinal: Positive for nausea and abdominal pain. Negative for vomiting.  Genitourinary: Positive for flank pain and decreased urine volume. Negative for dysuria.  Musculoskeletal: Positive for myalgias.  All other systems  reviewed and are negative.     Allergies  Dilaudid  Home Medications   Prior to Admission medications   Medication Sig Start Date End Date Taking? Authorizing Provider  hydrochlorothiazide (HYDRODIURIL) 25 MG tablet Take 25 mg by mouth daily.    Historical Provider, MD  ibuprofen (ADVIL,MOTRIN) 200 MG tablet Take 400 mg by mouth every 6 (six) hours as needed for moderate pain.    Historical Provider, MD  meloxicam (MOBIC) 15 MG tablet Take 1 tablet (15 mg total) by mouth daily. 06/10/16   Ivery QualeHobson Bryant, PA-C  naproxen (NAPROSYN) 500 MG tablet Take 500 mg by mouth daily as needed for moderate pain.    Historical Provider, MD  omeprazole (PRILOSEC) 40 MG capsule Take 40 mg by mouth daily as needed (stomach).     Historical Provider, MD  oxyCODONE-acetaminophen (PERCOCET/ROXICET) 5-325 MG tablet Take 1 tablet by mouth every 6 (six) hours as needed. 06/10/16   Ivery QualeHobson Bryant, PA-C  Vitamin D, Ergocalciferol, (DRISDOL) 50000 units CAPS capsule Take 50,000 Units by mouth 2 (two) times a week. Monday and Friday.    Historical Provider, MD   BP 141/69 mmHg  Pulse 88  Temp(Src) 99.9 F (37.7 C) (Oral)  Resp 18  Ht 5\' 5"  (1.651 m)  Wt 234 lb (106.142 kg)  BMI 38.94 kg/m2  SpO2 100%  LMP 05/09/2016 Physical Exam  Constitutional: She is oriented to person, place, and time. She appears well-developed and well-nourished.  HENT:  Head: Normocephalic and atraumatic.  Right Ear: External ear normal.  Left Ear: External ear normal.  Nose: Nose normal.  Eyes: Right eye exhibits no discharge. Left eye exhibits no discharge.  Cardiovascular: Normal rate, regular rhythm and normal heart sounds.   Pulmonary/Chest: Effort normal and breath sounds normal.  Abdominal: Soft. There is tenderness in the right lower quadrant. There is CVA tenderness (right cva).  Neurological: She is alert and oriented to person, place, and time.  Skin: Skin is warm and dry.  Nursing note and vitals reviewed.   ED  Course  Procedures (including critical care time) Labs Review Labs Reviewed  BASIC METABOLIC PANEL - Abnormal; Notable for the following:    Glucose, Bld 105 (*)    Calcium 8.3 (*)    Anion gap 4 (*)    All other components within normal limits  URINALYSIS, ROUTINE W REFLEX MICROSCOPIC (NOT AT [facility]) - Abnormal; Notable for the following:    Hgb urine dipstick SMALL (*)    All other components within normal limits  URINE MICROSCOPIC-ADD ON - Abnormal; Notable for the following:    Squamous Epithelial / LPF 6-30 (*)    Bacteria, UA FEW (*)    All other components within normal limits  CULTURE, BLOOD (ROUTINE X 2)  CULTURE, BLOOD (ROUTINE X 2)  URINE CULTURE  CBC WITH DIFFERENTIAL/PLATELET  LACTIC ACID, PLASMA  LACTIC ACID, PLASMA    Imaging Review Ct Renal Stone Study  06/10/2016  CLINICAL DATA:  Right flank pain since this morning EXAM: CT ABDOMEN AND PELVIS WITHOUT CONTRAST TECHNIQUE: Multidetector CT imaging of the abdomen and pelvis was performed following the standard protocol without IV contrast. COMPARISON:  None. FINDINGS: 5 mm proximal right ureteral calculus is associated with mild right hydronephrosis. Postcholecystectomy Liver, spleen, pancreas, adrenal glands are within normal limits. Left kidney is unremarkable. Normal appendix. Uterus and adnexa are within normal limits. Bladder is within normal limits Pelvic phleboliths. No free-fluid. Bilateral L4-5 facet arthropathy.  No compression deformity. IMPRESSION: 5 mm proximal right ureteral calculus is associated with mild right hydronephrosis. Electronically Signed   By: Jolaine Click M.D.   On: 06/10/2016 11:51   I have personally reviewed and evaluated these images and lab results as part of my medical decision-making.   EKG Interpretation None      MDM   Final diagnoses:  Fever in adult  Right ureteral stone    Patient is febrile but overall well appearing. Lactate, WBC and urine are all reassuring. No other  obvious source on history/exam. D/w Dr. Vernie Ammons who reviewed workup and with normal WBC, normal urine, he recommends IV rocephin, d/c with oral cipro and f/u in [facility] urology office tomorrow or his office tomorrow in [place]. Patient agreeable to plan. Discussed strict return precautions.   Pricilla Loveless, MD 06/11/16 [phone]

## 2016-06-11 NOTE — Consult Note (Signed)
I received a call from Dr. Criss AlvineGoldston in the ER at [facility] at 9:20 PM.  We discussed the patient's clinical situation.  This is her first kidney stone and she was seen yesterday in the ER with urine that was positive for bacteria but there were many squamous cells and no white cells in her urine at that time.  She returned with recurrent pain now with fever to 101 and her urine again was completely clear of any white cells with a few bacteria but squamous cells seen once again.  She is not a diabetic and had no fever with no elevation of her white count at 10.1.  She did not appear toxic and it was therefore my feeling that although she did have fever did not appear to be due to an active urinary tract infection as there was nothing to suggest this however we decided that the best course of action would be to treat her with Rocephin, and place her on oral Cipro with follow-up tomorrow at the clinic there I also indicated that I had time on my schedule and would be more than happy to see [place] in [place] for follow-up.  Her urine was cultured.

## 2016-06-11 NOTE — Progress Notes (Signed)
Pharmacy Antibiotic Note  Latoya Espinoza is a 40 y.o. female admitted on 06/11/2016 with UTI.  Pharmacy has been consulted for ROCEPHIN dosing.  Plan: Rocephin 1gm IV q24hrs Monitor labs, progress, c/s  Height: 5\' 5"  (165.1 cm) Weight: 234 lb (106.142 kg) IBW/kg (Calculated) : 57  Temp (24hrs), Avg:100.9 F (38.3 C), Min:99.9 F (37.7 C), Max:101.8 F (38.8 C)  No results for input(s): WBC, CREATININE, LATICACIDVEN, VANCOTROUGH, VANCOPEAK, VANCORANDOM, GENTTROUGH, GENTPEAK, GENTRANDOM, TOBRATROUGH, TOBRAPEAK, TOBRARND, AMIKACINPEAK, AMIKACINTROU, AMIKACIN in the last 168 hours.  CrCl cannot be calculated (Patient has no serum creatinine result on file.).    Allergies  Allergen Reactions  . Dilaudid [Hydromorphone Hcl] Hives   Anti-infectives    Start     Dose/Rate Route Frequency Ordered Stop   06/11/16 2100  cefTRIAXone (ROCEPHIN) 1 g in dextrose 5 % 50 mL IVPB     1 g 100 mL/hr over 30 Minutes Intravenous Every 24 hours 06/11/16 2024       No results found for this or any previous visit (from the past 240 hour(s)).  Thank you for allowing pharmacy to be a part of this patient's care.  Valrie HartHall, Abdulwahab Demelo A 06/11/2016 8:26 PM

## 2016-06-13 LAB — URINE CULTURE

## 2016-06-16 ENCOUNTER — Ambulatory Visit: Payer: BLUE CROSS/BLUE SHIELD | Admitting: Nutrition

## 2016-06-16 LAB — CULTURE, BLOOD (ROUTINE X 2)
CULTURE: NO GROWTH
CULTURE: NO GROWTH

## 2016-07-07 ENCOUNTER — Encounter: Payer: BLUE CROSS/BLUE SHIELD | Attending: Family Medicine | Admitting: Nutrition

## 2016-07-07 VITALS — Ht 65.0 in | Wt 237.4 lb

## 2016-07-07 DIAGNOSIS — R739 Hyperglycemia, unspecified: Secondary | ICD-10-CM

## 2016-07-07 DIAGNOSIS — E663 Overweight: Secondary | ICD-10-CM | POA: Insufficient documentation

## 2016-07-07 DIAGNOSIS — E669 Obesity, unspecified: Secondary | ICD-10-CM

## 2016-07-07 NOTE — Progress Notes (Signed)
  Medical Nutrition Therapy:  Appt start time: 0945 end time:  1630   Assessment:  Primary concerns today: Prediabetes and obesity. Follow up   Gained 4 lbs. She feels a lot of stress currently getting ready to move her daughter to college. She hasn't been able to exercise like she was. Her A1C came down to 5.5% from 6.3%. Feels better. Working on avoiding stress eating. Admits to needing to get back on track and planning meals and exercise better.   Preferred Learning Style:   No preference indicated   Learning Readiness:   Ready  Change in progress   MEDICATIONS: See list   DIETARY INTAKE:  24-hr recall:  B ( AM):  Egg, yogurt and toast, water,   Snk ( AM):  L ( PM): salads or wraps Snk ( PM):  D ( PM):  Grilled chicken, mushrooms, flat beans, water Snk ( PM):  Beverages: water,   Usual physical activity: walks   Estimated energy needs: 1500 calories 170 g carbohydrates 112 g protein 42 g fat  Progress Towards Goal(s):  In progress.   Nutritional Diagnosis:  NB-1.1 Food and nutrition-related knowledge deficit As related to Prediabetes and obesity.  As evidenced by A1C 6.3% and BMI > 40..    Intervention:  Nutrition and Diabetes education provided on My Plate, CHO counting, meal planning, portion sizes, timing of meals, avoiding snacks between meals unless having a low blood sugar, target ranges for A1C and blood sugars, signs/symptoms and treatment of hyper/hypoglycemia, monitoring blood sugars, taking medications as prescribed, benefits of exercising 30 minutes per day and prevention of complications of DM. Low Sodium Low fat high fiber diet..    Goals 1. Eat meals on time.. 2. Cut out unhealthy snacks and choose more veggies for snacks. 3. Recommit to exercising. 4. Exercise three times per week for 30 minutes. 5. Put times on calendar to exercise and work schedule around that. 6. Maintain or lose till next visit.    Teaching Method Utilized:   Visual Auditory Hands on  Handouts given during visit include:  The Plate Method  Meal Plan Card  Diabetes Instructions.   Barriers to learning/adherence to lifestyle change: None  Demonstrated degree of understanding via:  Teach Back   Monitoring/Evaluation:  Dietary intake, exercise, meal planning, SBG, and body weight in 2 month(s).

## 2016-07-07 NOTE — Patient Instructions (Signed)
Goals 1. Eat meals on time.. 2. Cut out unhealthy snacks and choose more veggies for snacks. 3. Recommit to exercising. 4. Exercise three times per week for 30 minutes. 5. Put times on calendar to exercise and work schedule around that. 6. Maintain or lose till next visit.

## 2016-09-14 ENCOUNTER — Encounter: Payer: Self-pay | Admitting: Nutrition

## 2016-09-14 ENCOUNTER — Ambulatory Visit: Payer: BLUE CROSS/BLUE SHIELD | Admitting: Nutrition

## 2017-06-06 ENCOUNTER — Ambulatory Visit (INDEPENDENT_AMBULATORY_CARE_PROVIDER_SITE_OTHER): Payer: BLUE CROSS/BLUE SHIELD | Admitting: Advanced Practice Midwife

## 2017-06-06 ENCOUNTER — Other Ambulatory Visit (HOSPITAL_COMMUNITY)
Admission: RE | Admit: 2017-06-06 | Discharge: 2017-06-06 | Disposition: A | Discharge status: Home / Self Care | Payer: BLUE CROSS/BLUE SHIELD | Source: Ambulatory Visit | Attending: Advanced Practice Midwife | Admitting: Advanced Practice Midwife

## 2017-06-06 ENCOUNTER — Encounter: Payer: Self-pay | Admitting: Advanced Practice Midwife

## 2017-06-06 VITALS — BP 110/80 | HR 80 | Ht 65.0 in | Wt 256.0 lb

## 2017-06-06 DIAGNOSIS — Z01419 Encounter for gynecological examination (general) (routine) without abnormal findings: Secondary | ICD-10-CM | POA: Insufficient documentation

## 2017-06-06 DIAGNOSIS — K219 Gastro-esophageal reflux disease without esophagitis: Secondary | ICD-10-CM | POA: Insufficient documentation

## 2017-06-06 DIAGNOSIS — M2550 Pain in unspecified joint: Secondary | ICD-10-CM | POA: Diagnosis not present

## 2017-06-06 DIAGNOSIS — R5383 Other fatigue: Secondary | ICD-10-CM | POA: Diagnosis not present

## 2017-06-06 DIAGNOSIS — R1032 Left lower quadrant pain: Secondary | ICD-10-CM | POA: Diagnosis not present

## 2017-06-06 DIAGNOSIS — N911 Secondary amenorrhea: Secondary | ICD-10-CM | POA: Diagnosis not present

## 2017-06-06 DIAGNOSIS — E669 Obesity, unspecified: Secondary | ICD-10-CM | POA: Insufficient documentation

## 2017-06-06 DIAGNOSIS — M1990 Unspecified osteoarthritis, unspecified site: Secondary | ICD-10-CM | POA: Diagnosis not present

## 2017-06-06 NOTE — Progress Notes (Signed)
Latoya Espinoza 41 y.o.  Vitals:   06/06/17 1506  BP: 110/80  Pulse: 80     Filed Weights   06/06/17 1506  Weight: 116.1 kg (256 lb)    Past Medical History: Past Medical History:  Diagnosis Date  . Arthritis   . GERD (gastroesophageal reflux disease)   . Obesity     Past Surgical History: Past Surgical History:  Procedure Laterality Date  . CHOLECYSTECTOMY      Family History: Family History  Problem Relation Age of Onset  . Cancer Mother        stage 4 lung cancer    Social History: Social History  Substance Use Topics  . Smoking status: Never Smoker  . Smokeless tobacco: Never Used  . Alcohol use No    Allergies:  Allergies  Allergen Reactions  . Dilaudid [Hydromorphone Hcl] Hives      Current Outpatient Prescriptions:  .  naproxen (NAPROSYN) 500 MG tablet, Take 500 mg by mouth daily as needed for moderate pain., Disp: , Rfl:  .  tiZANidine (ZANAFLEX) 4 MG tablet, Take 4 mg by mouth every 6 (six) hours as needed for muscle spasms., Disp: , Rfl:   History of Present Illness: Here for pap and physical.  Last pap 2015.  Using vasectomy for birth control. Has gained 30lbs in last 6 months (stress eating), joint pain and feels tired, lower abdominal pain (intermittent daily, last several minutes when it occurs, lower abdomen, right and left, sometimes one side, upper and lower abdomen, (? GI) Has also had intermittent nausea.  LMP 3/29 .  Hasn't skipped thembefore.) Had bloodwork w/PCP, but only tested CBC, CMP.  Nervous about having cancer because "every where I turn someone has cancer".    Review of Systems   Patient denies any headaches, blurred vision, shortness of breath, chest pain,  problems with bowel movements, urination, or intercourse.   Physical Exam: General:  Well developed, well nourished, no acute distress Skin:  Warm and dry Neck:  Midline trachea, normal thyroid Lungs; Clear to auscultation bilaterally Breast:  No dominant  palpable mass, retraction, or nipple discharge Cardiovascular: Regular rate and rhythm Abdomen:  Soft, non tender, no hepatosplenomegaly Pelvic:  External genitalia is normal in appearance.  The vagina is normal in appearance.  The cervix is bulbous.  Uterus is felt to be normal size, shape, and contour.  No adnexal masses or tenderness noted. Exam limited by habitus Extremities:  No swelling or varicosities noted Psych:  No mood changes.    UPT neg  Impression: Normal GYN exam Skipped 2 periods.  Discussed pg challenge.  DOesn't want to do that now, OK with not having a period for a few more months  Suspect ABD pain/nausea is GI related    Plan: If pap normal, repeat in 3 years Pelvic US to r/o GI source of pain--make GI referral if w/u here neg Rheumatoid/inflammation studies/TSH If no period in a few months, pg challenge, sooner if pt requests

## 2017-06-07 DIAGNOSIS — N911 Secondary amenorrhea: Secondary | ICD-10-CM | POA: Insufficient documentation

## 2017-06-08 LAB — RHEUMATOID FACTOR: Rhuematoid fact SerPl-aCnc: <10 IU/mL (ref 0.0–13.9)

## 2017-06-08 LAB — TSH: TSH: 3.01 u[IU]/mL (ref 0.450–4.500)

## 2017-06-08 LAB — ANA: Anti Nuclear Antibody(ANA): NEGATIVE

## 2017-06-08 LAB — SEDIMENTATION RATE: Sed Rate: 20 mm/hr (ref 0–32)

## 2017-06-11 ENCOUNTER — Telehealth: Payer: Self-pay | Admitting: Advanced Practice Midwife

## 2017-06-11 LAB — CYTOLOGY - PAP
Adequacy: ABSENT
Diagnosis: NEGATIVE
HPV (WINDOPATH): NOT DETECTED

## 2017-06-11 NOTE — Telephone Encounter (Signed)
Informed patient all lab results were normal including pap. Verbalized gratitude.

## 2017-06-13 ENCOUNTER — Other Ambulatory Visit: Payer: Self-pay | Admitting: Advanced Practice Midwife

## 2017-06-13 DIAGNOSIS — R1032 Left lower quadrant pain: Secondary | ICD-10-CM

## 2017-06-14 ENCOUNTER — Ambulatory Visit (INDEPENDENT_AMBULATORY_CARE_PROVIDER_SITE_OTHER): Payer: BLUE CROSS/BLUE SHIELD

## 2017-06-14 ENCOUNTER — Encounter: Payer: Self-pay | Admitting: Advanced Practice Midwife

## 2017-06-14 ENCOUNTER — Ambulatory Visit (INDEPENDENT_AMBULATORY_CARE_PROVIDER_SITE_OTHER): Payer: BLUE CROSS/BLUE SHIELD | Admitting: Advanced Practice Midwife

## 2017-06-14 VITALS — BP 104/78 | HR 62 | Wt 256.0 lb

## 2017-06-14 DIAGNOSIS — R1032 Left lower quadrant pain: Secondary | ICD-10-CM | POA: Diagnosis not present

## 2017-06-14 DIAGNOSIS — N911 Secondary amenorrhea: Secondary | ICD-10-CM | POA: Diagnosis not present

## 2017-06-14 DIAGNOSIS — N83201 Unspecified ovarian cyst, right side: Secondary | ICD-10-CM | POA: Diagnosis not present

## 2017-06-14 MED ORDER — NORGESTIMATE-ETH ESTRADIOL 0.25-35 MG-MCG PO TABS: 1.0000 | ORAL_TABLET | Freq: Every day | ORAL | 2 refills | Status: DC

## 2017-06-14 MED ORDER — MEDROXYPROGESTERONE ACETATE 10 MG PO TABS: 10.0000 mg | ORAL_TABLET | Freq: Every day | ORAL | 0 refills | Status: DC

## 2017-06-14 NOTE — Progress Notes (Signed)
PELVIC US TA/TV: homogeneous anteverted uterus,wnl,EEC 15.8 mm,normal left ovary,complex right ovarian cyst w/a septation  4.1 x 3 x 3.1 cm,limited view of ovaries on TV images,no free fluid,right adnexal pain during ultrasound

## 2017-06-14 NOTE — Progress Notes (Signed)
The  [facility] Visit  Patient name: Latoya Espinoza MRN [id number]  Date of birth: [birth year]  CC & HPI:  Latoya Espinoza is a 41 y.o. Caucasian female presenting today for [place] for lower pelvic pain. Still has not started a period.   Pertinent History Reviewed:  Medical & Surgical Hx:   Past Medical History:  Diagnosis Date  . Arthritis   . GERD (gastroesophageal reflux disease)   . Obesity    Past Surgical History:  Procedure Laterality Date  . CHOLECYSTECTOMY     Family History  Problem Relation Age of Onset  . Cancer Mother        stage 4 lung cancer    Current Outpatient Prescriptions:  .  naproxen (NAPROSYN) 500 MG tablet, Take 500 mg by mouth daily as needed for moderate pain., Disp: , Rfl:  .  tiZANidine (ZANAFLEX) 4 MG tablet, Take 4 mg by mouth every 6 (six) hours as needed for muscle spasms., Disp: , Rfl:  Social History: Reviewed -  reports that she has never smoked. She has never used smokeless tobacco.  Review of Systems:   Constitutional: Negative for fever and chills Eyes: Negative for visual disturbances Respiratory: Negative for shortness of breath, dyspnea Cardiovascular: Negative for chest pain or palpitations  Gastrointestinal: Negative for vono abdominal pain Genitourinary: Negative for dysuria and urgency, vaginal irritation or itching Musculoskeletal: Negative for back pain, joint pain, myalgias  Neurological: Negative for dizziness and headaches    Objective Findings:    Physical Examination: General appearance - well appearing, and in no distress Mental status - alert, oriented to person, place, and time Chest:  Normal respiratory effort Heart - normal rate and regular rhythm Pelvic: deferred Musculoskeletal:  Normal range of motion without pain Extremities:  No edema  PELVIC US TA/TV: homogeneous anteverted uterus,wnl,EEC 15.8 mm,normal left ovary,complex right ovarian cyst w/a septation  4.1 x 3 x 3.1 cm,limited  view of ovaries on TV images,no free fluid,right adnexal pain during ultrasound  No results found for this or any previous visit (from the past 24 hour(s)).    Assessment & Plan:  A:   Right complex ovarian cyst. Dr Despina Hidden reviewed images.  Secondary amenorrhea w/thickened endometrium   P:  1.  Start Provera tonight and take for 10 nights              If you start bleeding during these 10 daysm stop the provera and start the birth control pill on day 3 or 4 of bleeding. 2.  Once you have finished the provera, you should have bleeding within a week.  Start the birth control pill on day 3 or 4 of your bleeding (I went ahead and sent the prescription for Sprintec because I am on vacation the first of July) 3.  If you DON'T start bleeding by Monday, July 9, call Dr. Despina Hidden and let him know.  (The next step is probably a stronger progesterone trial to induce a period)  If your Ca125 blood test is NORMAL:       Stay on the birth control pill for 3 months, and we will plan to do a f/u ultrasound at the end of 3 months (call me mid-September and I will help get that scheduled).  If your Ca125 blood test is NOT NORMAL:      Still take the provera, but I will not have you start the birth control pill.  We will schedule a preop with Dr. Despina Hidden and  schedule laparoscopic surgery (at your convenience) to remove the cyst.        Espinoza,Latoya Aristizabal CNM 06/14/2017 2:48 PM

## 2017-06-15 ENCOUNTER — Encounter: Payer: Self-pay | Admitting: Advanced Practice Midwife

## 2017-06-15 LAB — CA 125: CA 125: 9.5 U/mL (ref 0.0–38.1)

## 2017-06-17 ENCOUNTER — Encounter: Payer: Self-pay | Admitting: Advanced Practice Midwife

## 2017-08-22 ENCOUNTER — Other Ambulatory Visit: Payer: Self-pay | Admitting: Advanced Practice Midwife

## 2017-08-22 ENCOUNTER — Telehealth: Payer: Self-pay | Admitting: *Deleted

## 2017-08-22 ENCOUNTER — Other Ambulatory Visit: Payer: BLUE CROSS/BLUE SHIELD

## 2017-08-22 DIAGNOSIS — R3129 Other microscopic hematuria: Secondary | ICD-10-CM

## 2017-08-22 DIAGNOSIS — R300 Dysuria: Secondary | ICD-10-CM

## 2017-08-22 MED ORDER — SULFAMETHOXAZOLE-TRIMETHOPRIM 800-160 MG PO TABS: 1.0000 | ORAL_TABLET | Freq: Two times a day (BID) | ORAL | 0 refills | Status: DC

## 2017-08-22 MED ORDER — PHENAZOPYRIDINE HCL 200 MG PO TABS: 200.0000 mg | ORAL_TABLET | Freq: Three times a day (TID) | ORAL | 0 refills | Status: DC | PRN

## 2017-08-22 NOTE — Progress Notes (Signed)
Septra for UTI sx, hematuria.

## 2017-08-22 NOTE — Telephone Encounter (Signed)
Patient notified that she may come and leave urine sample.

## 2017-08-22 NOTE — Telephone Encounter (Signed)
yes

## 2017-08-24 LAB — URINE CULTURE

## 2017-09-12 ENCOUNTER — Other Ambulatory Visit: Payer: Self-pay | Admitting: Obstetrics & Gynecology

## 2017-09-12 DIAGNOSIS — N83201 Unspecified ovarian cyst, right side: Secondary | ICD-10-CM

## 2017-09-13 ENCOUNTER — Ambulatory Visit: Payer: BLUE CROSS/BLUE SHIELD | Admitting: Advanced Practice Midwife

## 2017-09-13 ENCOUNTER — Other Ambulatory Visit: Payer: BLUE CROSS/BLUE SHIELD

## 2017-09-17 ENCOUNTER — Other Ambulatory Visit: Payer: Self-pay | Admitting: Advanced Practice Midwife

## 2017-09-17 DIAGNOSIS — N83201 Unspecified ovarian cyst, right side: Secondary | ICD-10-CM

## 2017-09-19 ENCOUNTER — Ambulatory Visit (INDEPENDENT_AMBULATORY_CARE_PROVIDER_SITE_OTHER): Payer: BLUE CROSS/BLUE SHIELD | Admitting: Advanced Practice Midwife

## 2017-09-19 ENCOUNTER — Encounter: Payer: Self-pay | Admitting: Advanced Practice Midwife

## 2017-09-19 ENCOUNTER — Ambulatory Visit (INDEPENDENT_AMBULATORY_CARE_PROVIDER_SITE_OTHER): Payer: BLUE CROSS/BLUE SHIELD

## 2017-09-19 VITALS — BP 128/80 | HR 68 | Wt 266.0 lb

## 2017-09-19 DIAGNOSIS — N83201 Unspecified ovarian cyst, right side: Secondary | ICD-10-CM

## 2017-09-19 DIAGNOSIS — N911 Secondary amenorrhea: Secondary | ICD-10-CM | POA: Diagnosis not present

## 2017-09-19 NOTE — Progress Notes (Signed)
[facility] Visit  Patient name: Latoya Espinoza MRN [id number]  Date of birth: 1976-03-15  CC & HPI:  Saretta Dahlem Reamy is a 41 y.o. Caucasian female presenting today for 3 month F/U complex ovarian cyst and secondary amenorrhea. Just finished 3rd pack of COCs, now plans to just see what happens w/her periods. Had 3 normal w/d bleeds. No pain w/cyst  Pertinent History Reviewed:  Medical & Surgical Hx:   Past Medical History:  Diagnosis Date  . Arthritis   . GERD (gastroesophageal reflux disease)   . Obesity    Past Surgical History:  Procedure Laterality Date  . CHOLECYSTECTOMY     Family History  Problem Relation Age of Onset  . Cancer Mother        stage 4 lung cancer    Current Outpatient Prescriptions:  .  medroxyPROGESTERone (PROVERA) 10 MG tablet, Take 1 tablet (10 mg total) by mouth daily., Disp: 10 tablet, Rfl: 0 .  naproxen (NAPROSYN) 500 MG tablet, Take 500 mg by mouth daily as needed for moderate pain., Disp: , Rfl:  .  norgestimate-ethinyl estradiol (ORTHO-CYCLEN,SPRINTEC,PREVIFEM) 0.25-35 MG-MCG tablet, Take 1 tablet by mouth daily. (Patient not taking: Reported on 09/19/2017), Disp: 1 Package, Rfl: 2 .  phenazopyridine (PYRIDIUM) 200 MG tablet, Take 1 tablet (200 mg total) by mouth 3 (three) times daily as needed for pain. (Patient not taking: Reported on 09/19/2017), Disp: 10 tablet, Rfl: 0 .  sulfamethoxazole-trimethoprim (BACTRIM DS,SEPTRA DS) 800-160 MG tablet, Take 1 tablet by mouth 2 (two) times daily. (Patient not taking: Reported on 09/19/2017), Disp: 10 tablet, Rfl: 0 .  tiZANidine (ZANAFLEX) 4 MG tablet, Take 4 mg by mouth every 6 (six) hours as needed for muscle spasms., Disp: , Rfl:  Social History: Reviewed -  reports that she has never smoked. She has never used smokeless tobacco.  Review of Systems:   Constitutional: Negative for fever and chills Eyes: Negative for visual disturbances Respiratory: Negative for shortness of breath,  dyspnea Cardiovascular: Negative for chest pain or palpitations  Gastrointestinal: Negative for vomiting, diarrhea and constipation; no abdominal pain Genitourinary: Negative for dysuria and urgency, vaginal irritation or itching Musculoskeletal: Negative for back pain, joint pain, myalgias  Neurological: Negative for dizziness and headaches    Objective Findings:   PELVIC US TA/TV: homogeneous anteverted uterus,wnl,EEC 7.6 mm,normal ovaries bilat (limited view),no free fluid,no pain during ultrasound  Physical Examination: General appearance - well appearing, and in no distress Mental status - alert, oriented to person, place, and time Chest:  Normal respiratory effort Heart - normal rate and regular rhythm Abdomen:  Soft, nontender Pelvic: deferred. Musculoskeletal:  Normal range of motion without pain Extremities:  No edema    No results found for this or any previous visit (from the past 24 hour(s)).    Assessment & Plan:  A: Ov cyst, resolved 2/2 amenorrhea, + W/D bleed X3 P:  If doesn't start cycling (at least somewhat) regularly, will induce several periods a year.    No Follow-up on file.  CRESENZO-DISHMAN,Aricka Goldberger CNM 09/21/2017 9:12 AM

## 2017-09-19 NOTE — Progress Notes (Signed)
PELVIC US TA/TV: homogeneous anteverted uterus,wnl,EEC 7.6 mm,normal ovaries bilat (limited view),no free fluid,no pain during ultrasound

## 2017-09-25 DIAGNOSIS — R252 Cramp and spasm: Secondary | ICD-10-CM | POA: Insufficient documentation

## 2017-09-25 DIAGNOSIS — I872 Venous insufficiency (chronic) (peripheral): Secondary | ICD-10-CM | POA: Insufficient documentation

## 2017-11-13 DIAGNOSIS — M1711 Unilateral primary osteoarthritis, right knee: Secondary | ICD-10-CM | POA: Insufficient documentation

## 2017-12-12 DIAGNOSIS — E782 Mixed hyperlipidemia: Secondary | ICD-10-CM | POA: Insufficient documentation

## 2017-12-16 DIAGNOSIS — Z6841 Body Mass Index (BMI) 40.0 and over, adult: Secondary | ICD-10-CM | POA: Insufficient documentation

## 2017-12-16 DIAGNOSIS — R7303 Prediabetes: Secondary | ICD-10-CM | POA: Insufficient documentation

## 2017-12-16 DIAGNOSIS — E559 Vitamin D deficiency, unspecified: Secondary | ICD-10-CM | POA: Insufficient documentation

## 2018-01-11 ENCOUNTER — Other Ambulatory Visit: Payer: BLUE CROSS/BLUE SHIELD

## 2018-01-11 DIAGNOSIS — R300 Dysuria: Secondary | ICD-10-CM

## 2018-01-11 NOTE — Progress Notes (Signed)
Patient called stating she thinks she has an UTI.

## 2018-01-12 IMAGING — CT CT RENAL STONE PROTOCOL
2 of 4 series · 16 of 46 positions shown, 18 images · non-contrast
Comparison: None.

CLINICAL DATA: Right flank pain since this morning

EXAM:
CT ABDOMEN AND PELVIS WITHOUT CONTRAST
TECHNIQUE: Multidetector CT imaging of the abdomen and pelvis was performed
following the standard protocol without IV contrast.

[Series 2: routine abd pel with · axial · 0.89mm/px · z∈[-492,-12]mm · 13 of 106 slices shown, 15 images]
[im 5/106  soft-tissue]
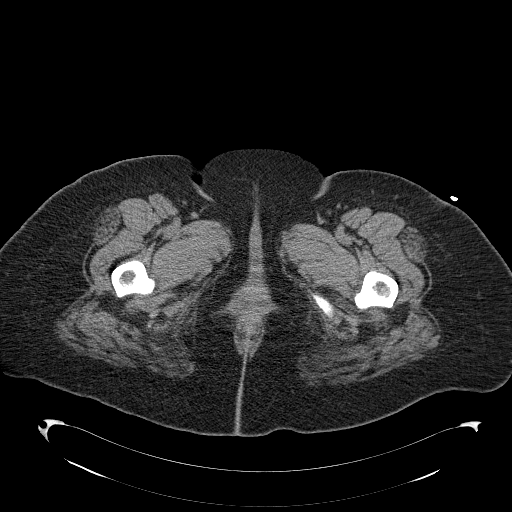
[im 5/106  bone]
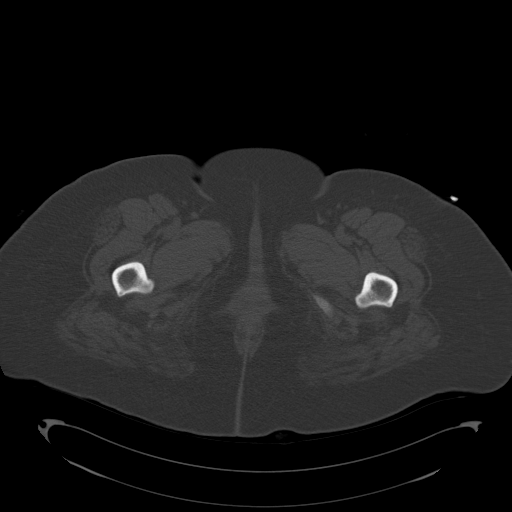
[im 14/106  soft-tissue]
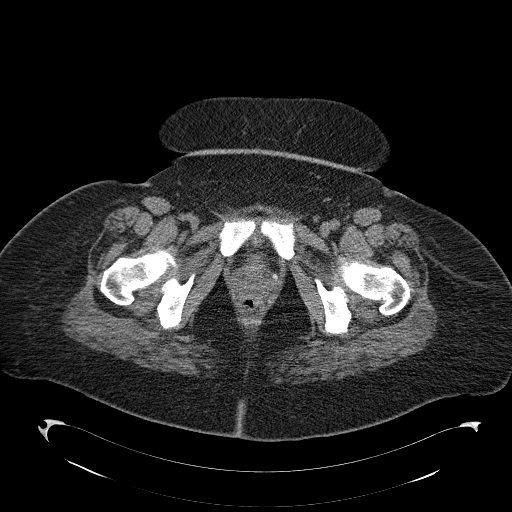
[im 23/106  soft-tissue]
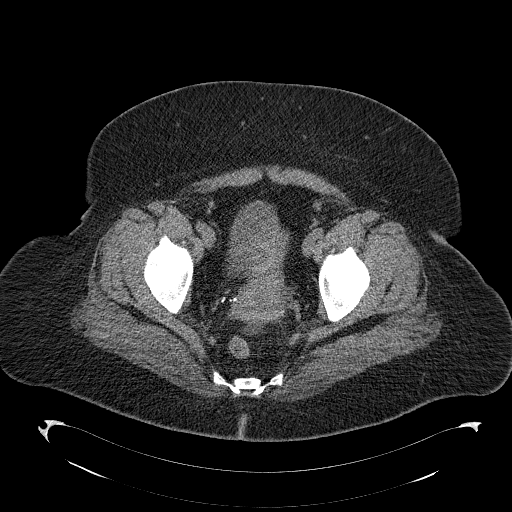
[im 28/106  soft-tissue]
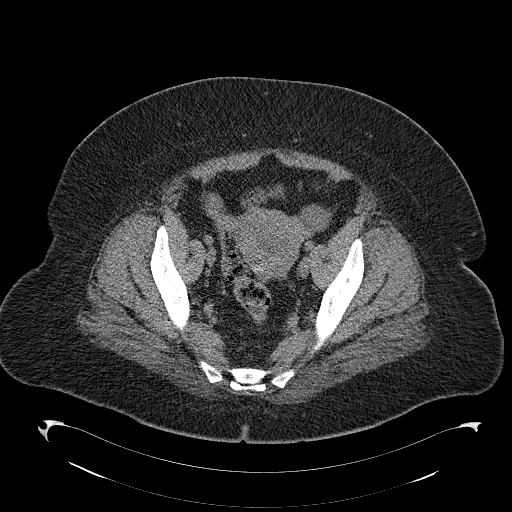
[im 37/106  soft-tissue]
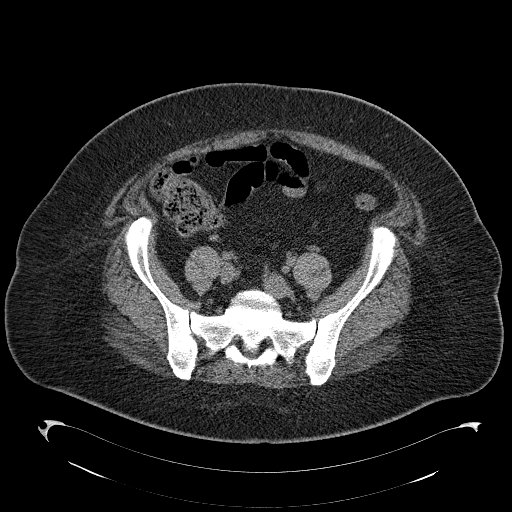
[im 46/106  soft-tissue]
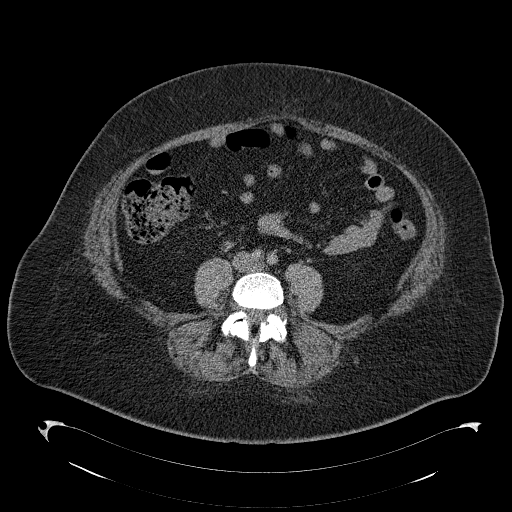
[im 55/106  soft-tissue]
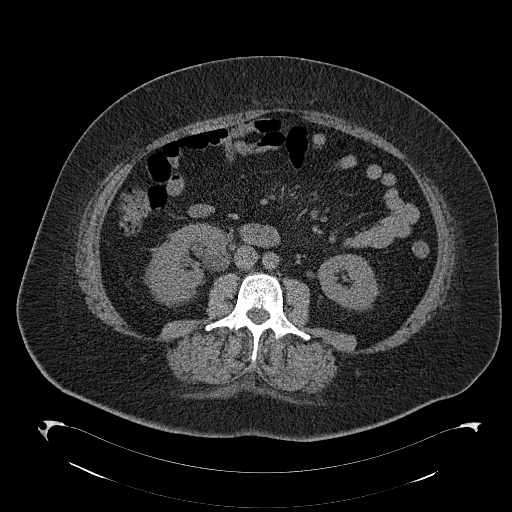
[im 60/106  soft-tissue]
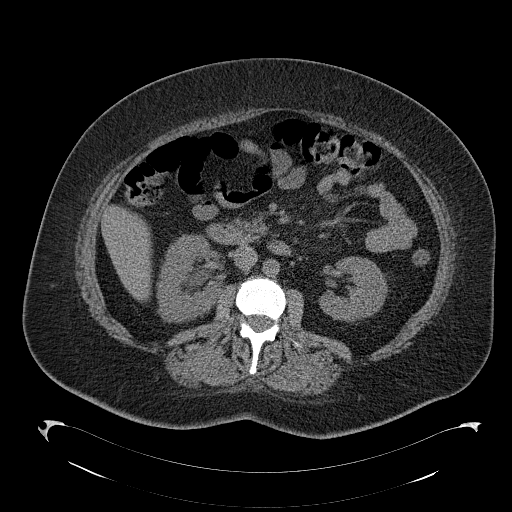
[im 69/106  soft-tissue]
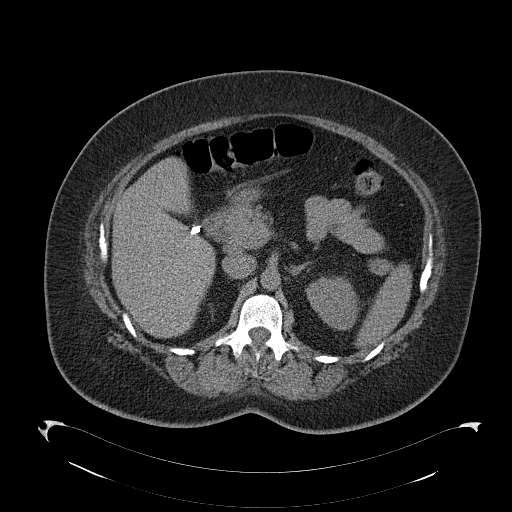
[im 69/106  bone]
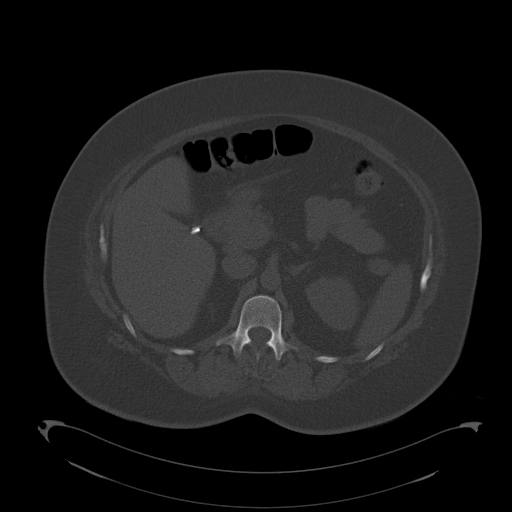
[im 78/106  soft-tissue]
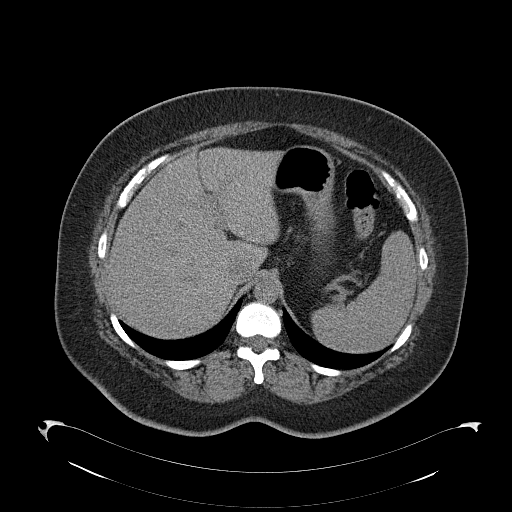
[im 83/106  soft-tissue]
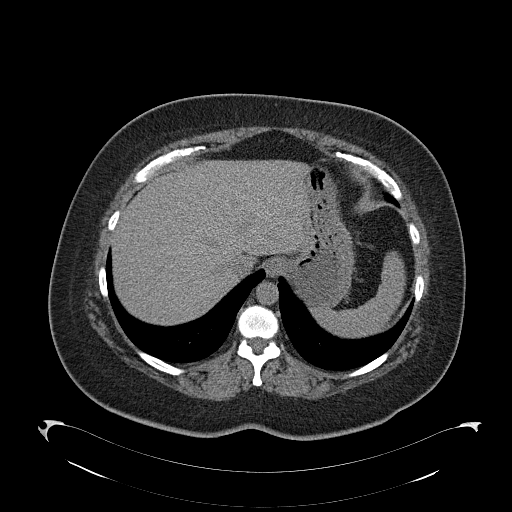
[im 92/106  soft-tissue]
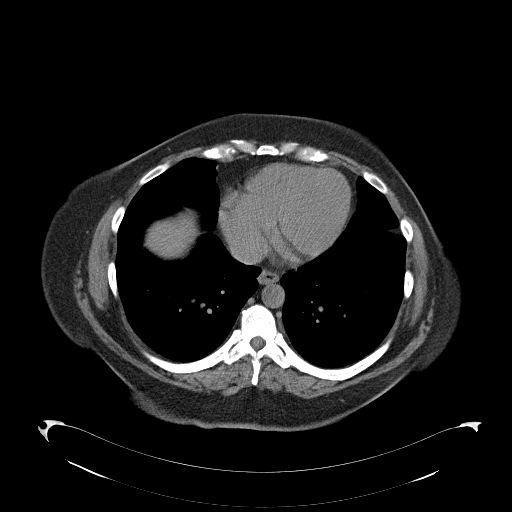
[im 101/106  soft-tissue]
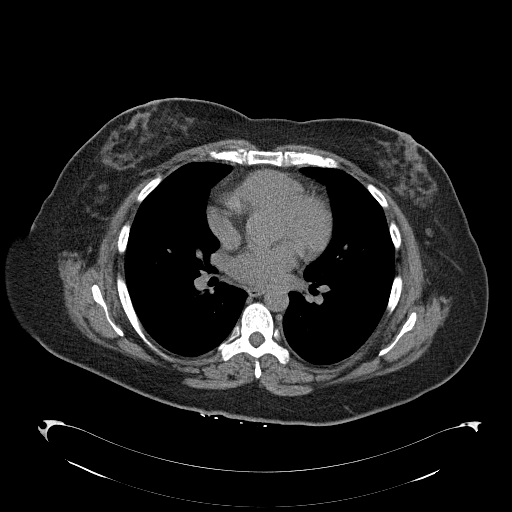

[Series 3: coronal · coronal · 0.81mm/px · 3 of 175 slices shown]
[im 59/175  soft-tissue]
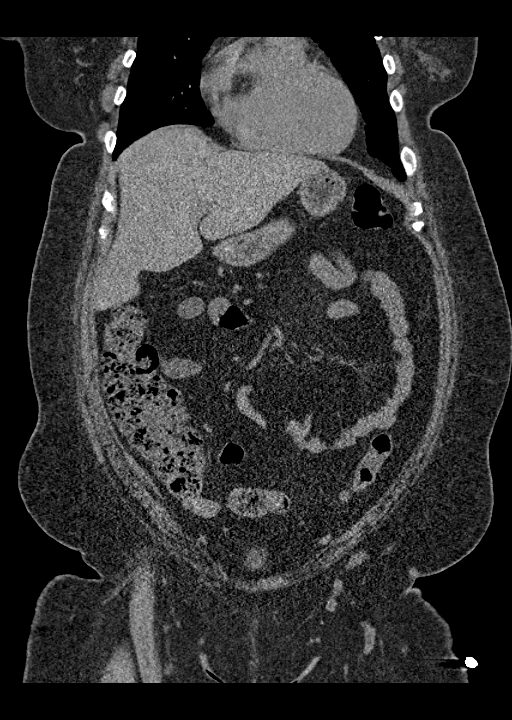
[im 78/175  soft-tissue]
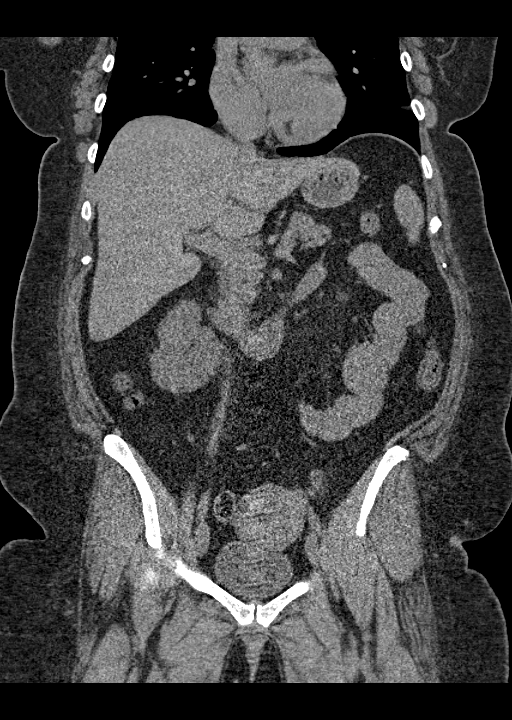
[im 97/175  soft-tissue]
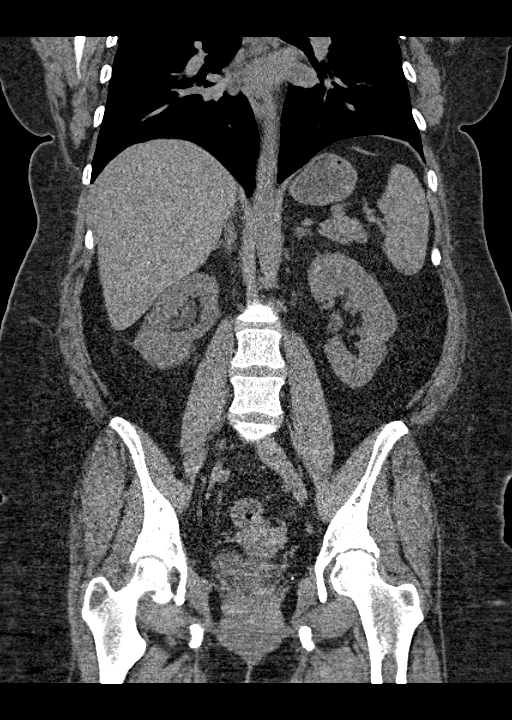

[16 of 46 positions shown; findings below may reference images not displayed]

FINDINGS: 5 mm proximal right ureteral calculus is associated with mild right
hydronephrosis.

Postcholecystectomy

Liver, spleen, pancreas, adrenal glands are within normal limits.

Left kidney is unremarkable.

Normal appendix.

Uterus and adnexa are within normal limits. Bladder is within normal
limits

Pelvic phleboliths.

No free-fluid.

Bilateral L4-5 facet arthropathy.  No compression deformity.
IMPRESSION: 5 mm proximal right ureteral calculus is associated with mild right
hydronephrosis.

## 2018-01-13 LAB — URINE CULTURE

## 2018-01-13 IMAGING — CR DG ABDOMEN 1V
2 series · 2 of 2 positions shown · non-contrast
Comparison: CT of the abdomen pelvis dated 06/10/2016

CLINICAL DATA: 40-year-old female with right ureteral stone seen on
the recent CT. Hematuria.

EXAM:
ABDOMEN - 1 VIEW

[supine ap (1 of 2)]
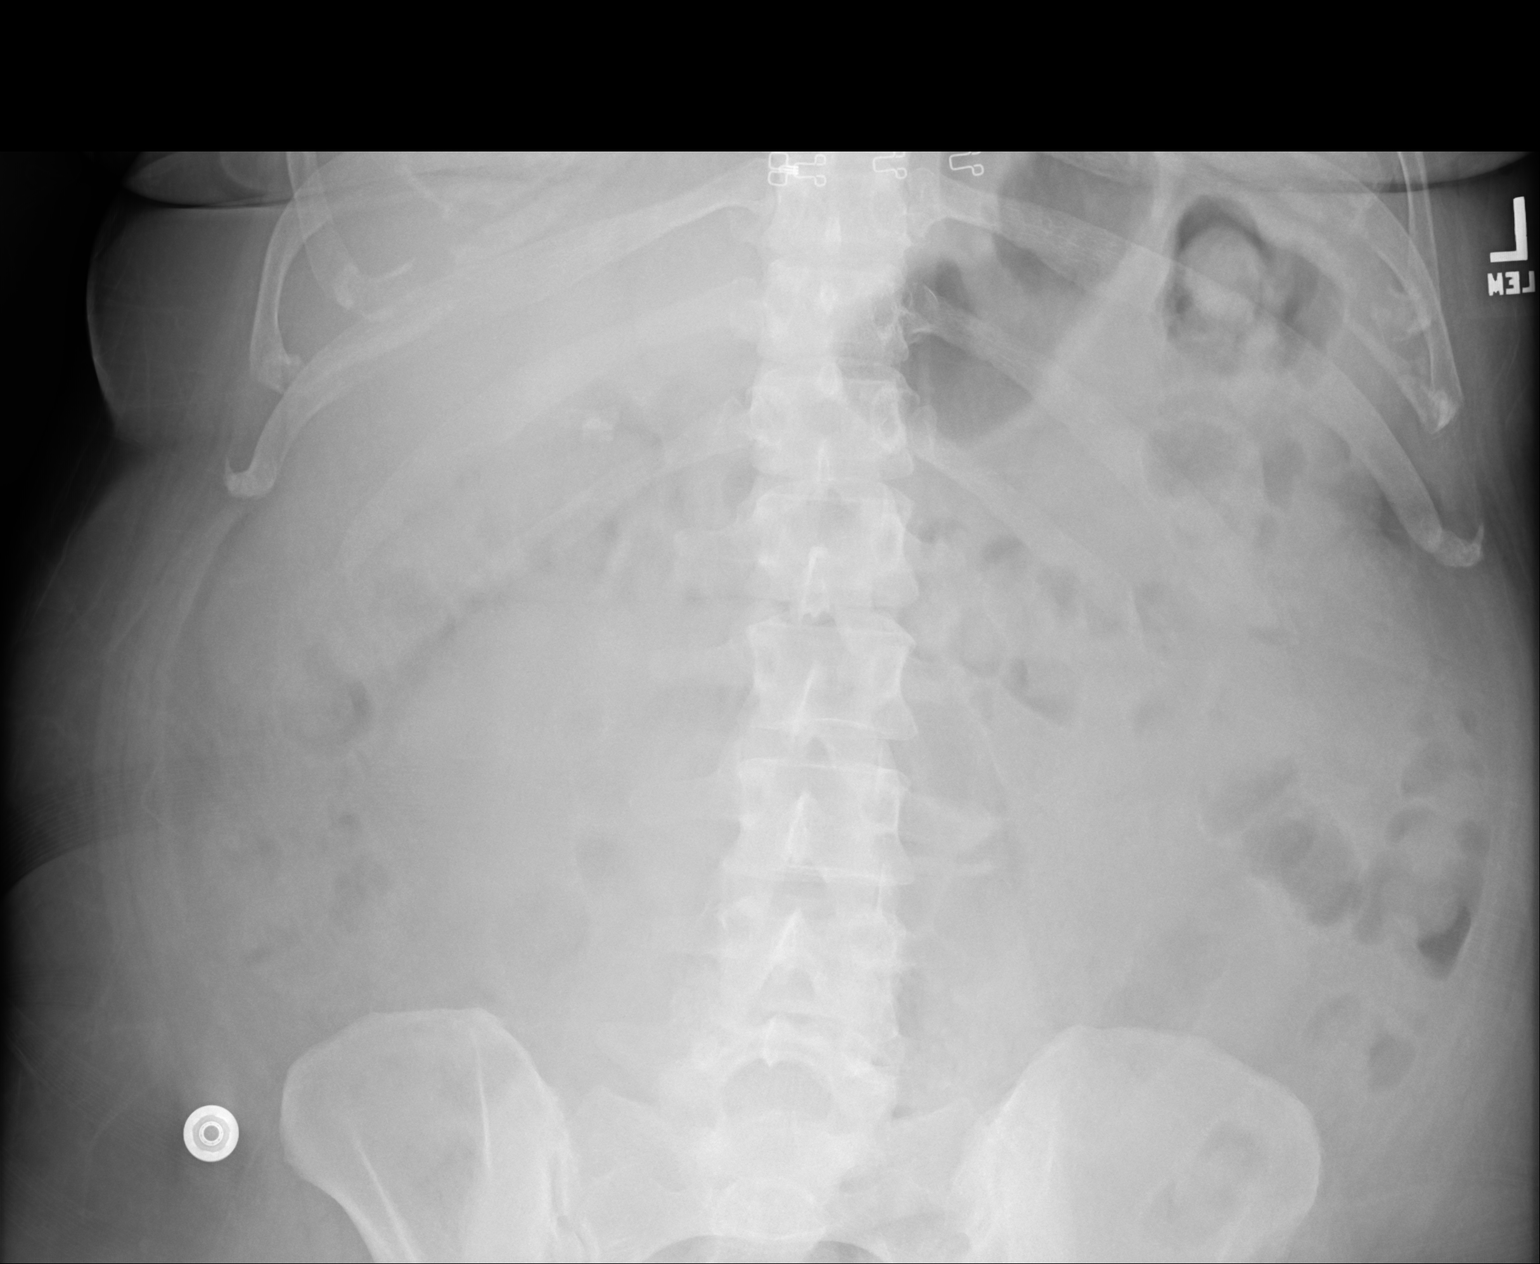

[supine ap (2 of 2)]
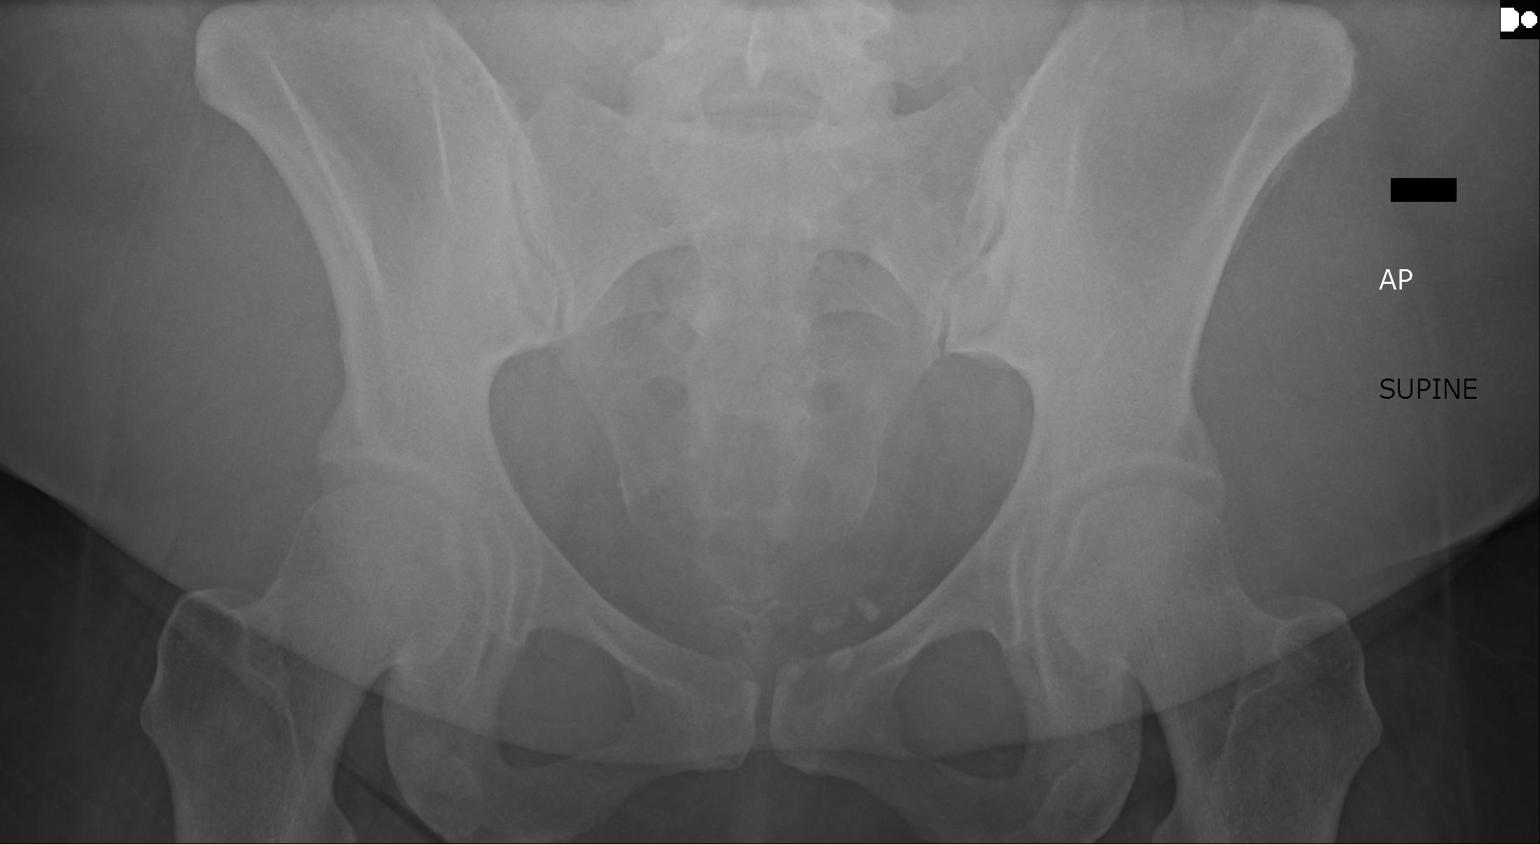

[2 of 2 positions shown; findings below may reference images not displayed]

FINDINGS: There is an 8 mm focal area of a amorphous density in the right
upper quadrant, and in a more superior position than the expected
renal calculus likely corresponding to the cholecystectomy clips. No
definite radiopaque calculus identified along the course of the
ureters or over the right renal silhouettes bilaterally. There is
moderate stool throughout the colon. There is no bowel dilatation or
evidence of obstruction. The osseous structures are intact. The soft
tissues are grossly unremarkable.
IMPRESSION: No definite radiopaque renal calculi identified.

## 2018-01-14 ENCOUNTER — Telehealth: Payer: Self-pay | Admitting: Advanced Practice Midwife

## 2018-01-15 NOTE — Telephone Encounter (Signed)
I returned patients call and informed her that urine culture did not grow anything, just mixed flora. Patient voiced understanding and had no further questions or concerns.

## 2018-02-05 ENCOUNTER — Emergency Department (HOSPITAL_COMMUNITY)
Admission: EM | Admit: 2018-02-05 | Discharge: 2018-02-05 | Disposition: A | Discharge status: Home / Self Care | Payer: BLUE CROSS/BLUE SHIELD | Attending: Emergency Medicine | Admitting: Emergency Medicine

## 2018-02-05 ENCOUNTER — Encounter (HOSPITAL_COMMUNITY): Payer: Self-pay | Admitting: Emergency Medicine

## 2018-02-05 ENCOUNTER — Emergency Department (HOSPITAL_COMMUNITY): Payer: BLUE CROSS/BLUE SHIELD

## 2018-02-05 ENCOUNTER — Other Ambulatory Visit: Payer: Self-pay

## 2018-02-05 DIAGNOSIS — Z79899 Other long term (current) drug therapy: Secondary | ICD-10-CM | POA: Diagnosis not present

## 2018-02-05 DIAGNOSIS — R0789 Other chest pain: Secondary | ICD-10-CM | POA: Diagnosis not present

## 2018-02-05 DIAGNOSIS — M25512 Pain in left shoulder: Secondary | ICD-10-CM | POA: Insufficient documentation

## 2018-02-05 LAB — BASIC METABOLIC PANEL
Anion gap: 10 (ref 5–15)
BUN: 8 mg/dL (ref 6–20)
CHLORIDE: 101 mmol/L (ref 101–111)
CO2: 25 mmol/L (ref 22–32)
CREATININE: 0.51 mg/dL (ref 0.44–1.00)
Calcium: 9 mg/dL (ref 8.9–10.3)
GFR calc Af Amer: >60 mL/min (ref >60)
GFR calc non Af Amer: >60 mL/min (ref >60)
Glucose, Bld: 117 mg/dL — ABNORMAL HIGH (ref 65–99)
Potassium: 3.6 mmol/L (ref 3.5–5.1)
SODIUM: 136 mmol/L (ref 135–145)

## 2018-02-05 LAB — CBC
HCT: 41.6 % (ref 36.0–46.0)
Hemoglobin: 13.4 g/dL (ref 12.0–15.0)
MCH: 28.4 pg (ref 26.0–34.0)
MCHC: 32.2 g/dL (ref 30.0–36.0)
MCV: 88.1 fL (ref 78.0–100.0)
PLATELETS: 278 10*3/uL (ref 150–400)
RBC: 4.72 MIL/uL (ref 3.87–5.11)
RDW: 13.3 % (ref 11.5–15.5)
WBC: 9.2 10*3/uL (ref 4.0–10.5)

## 2018-02-05 LAB — TROPONIN I: Troponin I: <0.03 ng/mL (ref <0.03)

## 2018-02-05 NOTE — ED Triage Notes (Signed)
Patient reports onset of chest pain last night, some nausea.

## 2018-02-05 NOTE — ED Provider Notes (Signed)
[facility] Provider Note   CSN: [id number] Arrival date & time: 02/05/18  1529     History   Chief Complaint Chief Complaint  Patient presents with  . Chest Pain    HPI Latoya Espinoza is a 42 y.o. female.  She presents for evaluation, which is aggravated by movement of her neck, pain in her anterior chest, and pain in her left arm.  Onset of this discomfort yesterday evening.  She recently started physical therapy for right knee pain, and neck pain, both of which were suspected to be from arthritis.  She had an MRI image done of her right knee, and plain images done of her neck.  No recent neck trauma.  No chest trauma.  No shortness of breath.  The discomfort in her chest is worse with deep breathing.  She is not taking any medication for it.  Her orthopedist prescribed Mobic today for the discomfort but she has not started it yet.  She denies nausea, vomiting, fever, chills, weakness or dizziness.  She does not take oral contraceptives.  Patient reports is increased stress in her life regarding home and family situations.  There are no other known modifying factors.  HPI  Past Medical History:  Diagnosis Date  . Arthritis   . GERD (gastroesophageal reflux disease)   . Obesity     Patient Active Problem List   Diagnosis Date Noted  . Amenorrhea, secondary 06/07/2017    Past Surgical History:  Procedure Laterality Date  . CHOLECYSTECTOMY      OB History    Gravida Para Term Preterm AB Living   2 2 2     2    SAB TAB Ectopic Multiple Live Births                   Home Medications    Prior to Admission medications   Medication Sig Start Date End Date Taking? Authorizing Provider  medroxyPROGESTERone (PROVERA) 10 MG tablet Take 1 tablet (10 mg total) by mouth daily. 06/14/17   Cresenzo-Dishmon, Scarlette CalicoFrances, CNM  naproxen (NAPROSYN) 500 MG tablet Take 500 mg by mouth daily as needed for moderate pain.    [provider]    norgestimate-ethinyl estradiol (ORTHO-CYCLEN,SPRINTEC,PREVIFEM) 0.25-35 MG-MCG tablet Take 1 tablet by mouth daily. Patient not taking: Reported on 09/19/2017 06/14/17   Cresenzo-Dishmon, Scarlette CalicoFrances, CNM  phenazopyridine (PYRIDIUM) 200 MG tablet Take 1 tablet (200 mg total) by mouth 3 (three) times daily as needed for pain. Patient not taking: Reported on 09/19/2017 08/22/17   Cresenzo-Dishmon, Scarlette CalicoFrances, CNM  sulfamethoxazole-trimethoprim (BACTRIM DS,SEPTRA DS) 800-160 MG tablet Take 1 tablet by mouth 2 (two) times daily. Patient not taking: Reported on 09/19/2017 08/22/17   Cresenzo-Dishmon, Scarlette CalicoFrances, CNM  tiZANidine (ZANAFLEX) 4 MG tablet Take 4 mg by mouth every 6 (six) hours as needed for muscle spasms.    [provider]    Family History Family History  Problem Relation Age of Onset  . Cancer Mother        stage 4 lung cancer    Social History Social History   Tobacco Use  . Smoking status: Never Smoker  . Smokeless tobacco: Never Used  Substance Use Topics  . Alcohol use: No  . Drug use: No     Allergies   Dilaudid [hydromorphone hcl]   Review of Systems Review of Systems  All other systems reviewed and are negative.    Physical Exam Updated Vital Signs BP 127/82 (BP Location: Right Arm)  Pulse 64   Temp 98.2 F (36.8 C) (Oral)   Resp 16   Ht 5\' 5"  (1.651 m)   Wt 114.3 kg (252 lb)   LMP 12/15/2017   SpO2 100%   BMI 41.93 kg/m   Physical Exam  Constitutional: She is oriented to person, place, and time. She appears well-developed. She does not appear ill.  Overweight  HENT:  Head: Normocephalic and atraumatic.  Eyes: Conjunctivae and EOM are normal. Pupils are equal, round, and reactive to light.  Neck: Normal range of motion and phonation normal. Neck supple.  Cardiovascular: Normal rate and regular rhythm.  Pulmonary/Chest: Effort normal and breath sounds normal. She exhibits no tenderness.  Abdominal: Soft. She exhibits no distension. There is no  tenderness. There is no guarding.  Musculoskeletal: Normal range of motion.  Normal range of motion arms and legs bilaterally.  Mild left anterior upper chest discomfort to palpation.  Neurological: She is alert and oriented to person, place, and time. She exhibits normal muscle tone.  Skin: Skin is warm and dry.  Psychiatric: She has a normal mood and affect. Her behavior is normal. Judgment and thought content normal.  Nursing note and vitals reviewed.    ED Treatments / Results  Labs (all labs ordered are listed, but only abnormal results are displayed) Labs Reviewed  BASIC METABOLIC PANEL - Abnormal; Notable for the following components:      Result Value   Glucose, Bld 117 (*)    All other components within normal limits  CBC  TROPONIN I  I-STAT BETA HCG BLOOD, ED ([facility], [facility], [facility] ONLY)    EKG  EKG Interpretation  Date/Time:  Tuesday February 05 2018 15:47:12 EST Ventricular Rate:  77 PR Interval:  162 QRS Duration: 94 QT Interval:  404 QTC Calculation: 457 R Axis:   56 Text Interpretation:  Normal sinus rhythm with sinus arrhythmia Normal ECG No old tracing to compare Confirmed by Mancel Bale [id number]) on 02/05/2018 9:43:48 PM       Radiology Dg Chest 2 View  Result Date: 02/05/2018 CLINICAL DATA:  Chest pain started last night EXAM: CHEST  2 VIEW COMPARISON:  04/02/2005 FINDINGS: The heart size and mediastinal contours are within normal limits. Both lungs are clear. The visualized skeletal structures are unremarkable. IMPRESSION: No active cardiopulmonary disease. Electronically Signed   By: Elige Ko   On: 02/05/2018 16:27    Procedures Procedures (including critical care time)  Medications Ordered in ED Medications - No data to display   Initial Impression / Assessment and Plan / ED Course  I have reviewed the triage vital signs and the nursing notes.  Pertinent labs & imaging results that were available during my care of the patient were reviewed by me  and considered in my medical decision making (see chart for details).      Patient Vitals for the past 24 hrs:  BP Temp Temp src Pulse Resp SpO2 Height Weight  02/05/18 2158 127/82 - - - - - - -  02/05/18 1550 (!) 141/74 98.2 F (36.8 C) Oral 64 16 100 % - -  02/05/18 1548 - - - - - - 5\' 5"  (1.651 m) 114.3 kg (252 lb)    9:59 PM Reevaluation with update and discussion. After initial assessment and treatment, an updated evaluation reveals no change in clinical status.  Findings discussed with patient and her husband, all questions were answered. Mancel Bale     Final Clinical Impressions(s) / ED Diagnoses   Final  diagnoses:  Chest wall pain  Left shoulder pain, unspecified chronicity   Nonspecific pain, doubt ACS, PE, deep venous thrombosis, metabolic instability or impending vascular collapse.  Doubt spinal myelopathy.  Possible cervical radiculopathy.  Nursing Notes Reviewed/ Care Coordinated Applicable Imaging Reviewed Interpretation of Laboratory Data incorporated into ED treatment  The patient appears reasonably screened and/or stabilized for discharge and I doubt any other medical condition or other [facility] requiring further screening, evaluation, or treatment in the ED at this time prior to discharge.  Plan: Home Medications-continue prescribed medications; Home Treatments-rest, heat, stress management; return here if the recommended treatment, does not improve the symptoms; Recommended follow up-PCP, and orthopedic PRN.   ED Discharge Orders    None       Mancel Bale, MD 02/05/18 2209

## 2018-02-05 NOTE — Discharge Instructions (Signed)
There are no signs of serious problems with your heart, or lungs, or veins..  Symptoms like you have can be caused by arthritis, which could be from your neck.  Also, stress can play into this.  Make sure that you are working on reducing stress.  For pain take the Mobic, which her doctor prescribed.  Also it helps use heat on the sore area, 2 or 3 times a day.

## 2018-03-28 ENCOUNTER — Telehealth: Payer: Self-pay | Admitting: Advanced Practice Midwife

## 2018-03-28 MED ORDER — ALPRAZOLAM 0.5 MG PO TABS: ORAL_TABLET | ORAL | 0 refills | Status: DC

## 2018-03-28 MED ORDER — MEGESTROL ACETATE 40 MG PO TABS
ORAL_TABLET | ORAL | 3 refills | Status: DC
Start: 2018-03-28 — End: 2018-07-11

## 2018-03-28 NOTE — Telephone Encounter (Signed)
Patient called stating that she was speaking with Drenda FreezeFran regarding a medication and Drenda FreezeFran told her that patient needed to call 2 month before her trip and she will send in the medication. Please contact pt

## 2018-03-28 NOTE — Telephone Encounter (Signed)
Going to [place] in 2 months.  Spotting a little now. Wants megace  Start w/1 daily.  If BTB, call me and we can figure it out . Has never flown, very nervous  Xanax 0.5mg  before each flight, #4, no RF

## 2018-05-01 DIAGNOSIS — M25561 Pain in right knee: Secondary | ICD-10-CM | POA: Diagnosis not present

## 2018-05-01 DIAGNOSIS — M1711 Unilateral primary osteoarthritis, right knee: Secondary | ICD-10-CM | POA: Diagnosis not present

## 2018-05-08 DIAGNOSIS — M1711 Unilateral primary osteoarthritis, right knee: Secondary | ICD-10-CM | POA: Diagnosis not present

## 2018-05-08 DIAGNOSIS — M25561 Pain in right knee: Secondary | ICD-10-CM | POA: Diagnosis not present

## 2018-05-15 DIAGNOSIS — M1711 Unilateral primary osteoarthritis, right knee: Secondary | ICD-10-CM | POA: Diagnosis not present

## 2018-05-15 DIAGNOSIS — M25561 Pain in right knee: Secondary | ICD-10-CM | POA: Diagnosis not present

## 2018-05-16 ENCOUNTER — Encounter: Payer: Self-pay | Admitting: Advanced Practice Midwife

## 2018-05-16 ENCOUNTER — Other Ambulatory Visit: Payer: Self-pay | Admitting: Advanced Practice Midwife

## 2018-05-16 ENCOUNTER — Encounter (INDEPENDENT_AMBULATORY_CARE_PROVIDER_SITE_OTHER): Payer: Self-pay

## 2018-05-16 DIAGNOSIS — Z131 Encounter for screening for diabetes mellitus: Secondary | ICD-10-CM

## 2018-05-16 DIAGNOSIS — Z13220 Encounter for screening for lipoid disorders: Secondary | ICD-10-CM

## 2018-05-17 ENCOUNTER — Other Ambulatory Visit: Payer: Self-pay

## 2018-05-17 DIAGNOSIS — Z13220 Encounter for screening for lipoid disorders: Secondary | ICD-10-CM | POA: Diagnosis not present

## 2018-05-17 DIAGNOSIS — Z131 Encounter for screening for diabetes mellitus: Secondary | ICD-10-CM | POA: Diagnosis not present

## 2018-05-18 LAB — LIPID PANEL
CHOLESTEROL TOTAL: 211 mg/dL — AB (ref 100–199)
Chol/HDL Ratio: 3.3 ratio (ref 0.0–4.4)
HDL: 64 mg/dL (ref >39)
LDL CALC: 118 mg/dL — AB (ref 0–99)
TRIGLYCERIDES: 147 mg/dL (ref 0–149)
VLDL CHOLESTEROL CAL: 29 mg/dL (ref 5–40)

## 2018-05-18 LAB — HEMOGLOBIN A1C
Est. average glucose Bld gHb Est-mCnc: 131 mg/dL
Hgb A1c MFr Bld: 6.2 % — ABNORMAL HIGH (ref 4.8–5.6)

## 2018-05-20 DIAGNOSIS — R102 Pelvic and perineal pain: Secondary | ICD-10-CM | POA: Diagnosis not present

## 2018-05-20 DIAGNOSIS — R3129 Other microscopic hematuria: Secondary | ICD-10-CM | POA: Diagnosis not present

## 2018-05-21 DIAGNOSIS — M25561 Pain in right knee: Secondary | ICD-10-CM | POA: Diagnosis not present

## 2018-05-21 DIAGNOSIS — M1711 Unilateral primary osteoarthritis, right knee: Secondary | ICD-10-CM | POA: Diagnosis not present

## 2018-05-22 ENCOUNTER — Encounter (INDEPENDENT_AMBULATORY_CARE_PROVIDER_SITE_OTHER): Payer: Self-pay

## 2018-06-04 ENCOUNTER — Encounter: Payer: Self-pay | Admitting: Advanced Practice Midwife

## 2018-06-05 ENCOUNTER — Other Ambulatory Visit: Payer: Self-pay | Admitting: Advanced Practice Midwife

## 2018-06-05 ENCOUNTER — Other Ambulatory Visit: Payer: 59

## 2018-06-05 DIAGNOSIS — R310 Gross hematuria: Secondary | ICD-10-CM | POA: Diagnosis not present

## 2018-06-07 ENCOUNTER — Telehealth: Payer: Self-pay | Admitting: Obstetrics & Gynecology

## 2018-06-07 LAB — URINE CULTURE

## 2018-06-07 NOTE — Telephone Encounter (Signed)
Pt requesting urine culture results. Informed pt that results were not back yet. Advised that it can take 3 days for results to come back. Pt verbalized understanding.

## 2018-06-10 ENCOUNTER — Encounter: Payer: Self-pay | Admitting: Advanced Practice Midwife

## 2018-06-20 ENCOUNTER — Encounter: Payer: Self-pay | Admitting: Advanced Practice Midwife

## 2018-06-20 DIAGNOSIS — R600 Localized edema: Secondary | ICD-10-CM | POA: Diagnosis not present

## 2018-06-20 DIAGNOSIS — R252 Cramp and spasm: Secondary | ICD-10-CM | POA: Diagnosis not present

## 2018-06-20 DIAGNOSIS — R3129 Other microscopic hematuria: Secondary | ICD-10-CM | POA: Diagnosis not present

## 2018-06-20 DIAGNOSIS — Z6841 Body Mass Index (BMI) 40.0 and over, adult: Secondary | ICD-10-CM | POA: Diagnosis not present

## 2018-06-20 DIAGNOSIS — R7301 Impaired fasting glucose: Secondary | ICD-10-CM | POA: Diagnosis not present

## 2018-06-25 DIAGNOSIS — R252 Cramp and spasm: Secondary | ICD-10-CM | POA: Diagnosis not present

## 2018-06-25 DIAGNOSIS — R7301 Impaired fasting glucose: Secondary | ICD-10-CM | POA: Diagnosis not present

## 2018-06-25 DIAGNOSIS — R600 Localized edema: Secondary | ICD-10-CM | POA: Diagnosis not present

## 2018-06-25 DIAGNOSIS — R3129 Other microscopic hematuria: Secondary | ICD-10-CM | POA: Diagnosis not present

## 2018-06-25 DIAGNOSIS — R102 Pelvic and perineal pain: Secondary | ICD-10-CM | POA: Diagnosis not present

## 2018-06-25 DIAGNOSIS — Z6841 Body Mass Index (BMI) 40.0 and over, adult: Secondary | ICD-10-CM | POA: Diagnosis not present

## 2018-06-25 DIAGNOSIS — R319 Hematuria, unspecified: Secondary | ICD-10-CM | POA: Diagnosis not present

## 2018-07-02 ENCOUNTER — Encounter: Payer: Self-pay | Admitting: Obstetrics & Gynecology

## 2018-07-02 ENCOUNTER — Ambulatory Visit (INDEPENDENT_AMBULATORY_CARE_PROVIDER_SITE_OTHER): Payer: 59 | Admitting: Obstetrics & Gynecology

## 2018-07-02 VITALS — BP 137/84 | HR 71 | Ht 65.0 in | Wt 265.0 lb

## 2018-07-02 DIAGNOSIS — N938 Other specified abnormal uterine and vaginal bleeding: Secondary | ICD-10-CM

## 2018-07-02 DIAGNOSIS — Z01818 Encounter for other preprocedural examination: Secondary | ICD-10-CM

## 2018-07-02 NOTE — Progress Notes (Signed)
Preoperative History and Physical  Latoya Espinoza is a 42 y.o. [id number] with Patient's last menstrual period was 05/31/2018 (exact date). 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 Arthritis   . GERD (gastroesophageal reflux disease)   . Obesity     PSH:     Past Surgical History:  Procedure Laterality Date  . CHOLECYSTECTOMY      POb/GynH:      OB History    Gravida  2   Para  2   Term  2   Preterm      AB      Living  2     SAB      TAB      Ectopic      Multiple      Live Births              SH:   Social History   Tobacco Use  . Smoking status: Never Smoker  . Smokeless tobacco: Never Used  Substance Use Topics  . Alcohol use: No  . Drug use: No    FH:    Family History  Problem Relation Age of Onset  . Cancer Mother        stage 4 lung cancer     Allergies:  Allergies  Allergen Reactions  . Dilaudid [Hydromorphone Hcl] Hives    Medications:       Current Outpatient Medications:  .  megestrol (MEGACE) 40 MG tablet, 1-2/day PO prn bleeding (Patient taking differently: 160 mg daily. 1-2/day PO prn bleeding), Disp: 60 tablet, Rfl: 3 .  meloxicam (MOBIC) 15 MG tablet, Take by mouth., Disp: , Rfl:  .  ALPRAZolam (XANAX) 0.5 MG tablet, Take one tablet before each flight (Patient not taking: Reported on 07/02/2018), Disp: 4 tablet, Rfl: 0 .  medroxyPROGESTERone (PROVERA) 10 MG tablet, Take 1 tablet (10 mg total) by mouth daily. (Patient not taking: Reported on 07/02/2018), Disp: 10 tablet, Rfl: 0 .  naproxen (NAPROSYN) 500 MG tablet, Take 500 mg by mouth daily as needed for moderate pain., Disp: , Rfl:  .  norgestimate-ethinyl estradiol (ORTHO-CYCLEN,SPRINTEC,PREVIFEM) 0.25-35 MG-MCG tablet, Take 1 tablet by mouth daily. (Patient not taking: Reported on 09/19/2017), Disp: 1 Package, Rfl: 2 .   phenazopyridine (PYRIDIUM) 200 MG tablet, Take 1 tablet (200 mg total) by mouth 3 (three) times daily as needed for pain. (Patient not taking: Reported on 09/19/2017), Disp: 10 tablet, Rfl: 0 .  sulfamethoxazole-trimethoprim (BACTRIM DS,SEPTRA DS) 800-160 MG tablet, Take 1 tablet by mouth 2 (two) times daily. (Patient not taking: Reported on 09/19/2017), Disp: 10 tablet, Rfl: 0 .  tiZANidine (ZANAFLEX) 4 MG tablet, Take 4 mg by mouth every 6 (six) hours as needed for muscle spasms., Disp: , Rfl:   Review of Systems:   Review of Systems  Constitutional: Negative for fever, chills, weight loss, malaise/fatigue and diaphoresis.  HENT: Negative for hearing loss, ear pain, nosebleeds, congestion, sore throat, neck pain, tinnitus and ear discharge.   Eyes: Negative for blurred vision, double vision, photophobia, pain, discharge and redness.  Respiratory: Negative for cough, hemoptysis, sputum production, shortness of breath, wheezing and stridor.   Cardiovascular: Negative for chest pain, palpitations, orthopnea, claudication, leg swelling and PND.  Gastrointestinal: Positive for abdominal pain. Negative for heartburn, nausea, vomiting, diarrhea, constipation, blood in stool and melena.  Genitourinary: Negative for dysuria, urgency, frequency, hematuria  and flank pain.  Musculoskeletal: Negative for myalgias, back pain, joint pain and falls.  Skin: Negative for itching and rash.  Neurological: Negative for dizziness, tingling, tremors, sensory change, speech change, focal weakness, seizures, loss of consciousness, weakness and headaches.  Endo/Heme/Allergies: Negative for environmental allergies and polydipsia. Does not bruise/bleed easily.  Psychiatric/Behavioral: Negative for depression, suicidal ideas, hallucinations, memory loss and substance abuse. The patient is not nervous/anxious and does not have insomnia.      PHYSICAL EXAM:  Blood pressure 137/84, pulse 71, height 5\' 5"  (1.651 m), weight  265 lb (120.2 kg), last menstrual period 05/31/2018.    Vitals reviewed. Constitutional: She is oriented to person, place, and time. She appears well-developed and well-nourished.  HENT:  Head: Normocephalic and atraumatic.  Right Ear: External ear normal.  Left Ear: External ear normal.  Nose: Nose normal.  Mouth/Throat: Oropharynx is clear and moist.  Eyes: Conjunctivae and EOM are normal. Pupils are equal, round, and reactive to light. Right eye exhibits no discharge. Left eye exhibits no discharge. No scleral icterus.  Neck: Normal range of motion. Neck supple. No tracheal deviation present. No thyromegaly present.  Cardiovascular: Normal rate, regular rhythm, normal heart sounds and intact distal pulses.  Exam reveals no gallop and no friction rub.   No murmur heard. Respiratory: Effort normal and breath sounds normal. No respiratory distress. She has no wheezes. She has no rales. She exhibits no tenderness.  GI: Soft. Bowel sounds are normal. She exhibits no distension and no mass. There is tenderness. There is no rebound and no guarding.  Genitourinary:       Vulva is normal without lesions Vagina is pink moist without discharge Cervix normal in appearance and pap is normal Uterus is normal size, contour, position, consistency, mobility, non-tender Adnexa is negative with normal sized ovaries by sonogram  Musculoskeletal: Normal range of motion. She exhibits no edema and no tenderness.  Neurological: She is alert and oriented to person, place, and time. She has normal reflexes. She displays normal reflexes. No cranial nerve deficit. She exhibits normal muscle tone. Coordination normal.  Skin: Skin is warm and dry. No rash noted. No erythema. No pallor.  Psychiatric: She has a normal mood and affect. Her behavior is normal. Judgment and thought content normal.    Labs: No results found for this or any previous visit (from the past 336 hour(s)).  EKG: Orders placed or performed  during the hospital encounter of 02/05/18  . EKG 12-Lead  . EKG 12-Lead  . ED EKG within 10 minutes  . ED EKG within 10 minutes    Imaging Studies: See sonogram report, normal eval    Assessment: DUB: over 1 year now uncontrollable on megestrol  Plan: Hysteroscopy uterine curettage Minerva endometrial ablation 07/10/2018  Amaryllis DykeLuther H Rowene Suto 07/02/2018 10:07 AM     Face to face time:  15 minutes  Greater than 50% of the visit time was spent in counseling and coordination of care with the patient.  The summary and outline of the counseling and care coordination is summarized in the note above.   All questions were answered.

## 2018-07-03 ENCOUNTER — Other Ambulatory Visit: Payer: Self-pay | Admitting: Obstetrics & Gynecology

## 2018-07-03 ENCOUNTER — Encounter: Payer: Self-pay | Admitting: Obstetrics & Gynecology

## 2018-07-03 NOTE — Patient Instructions (Signed)
Your procedure is scheduled on: 07/10/2018  [facility] to [facility] at   6:15  AM.  Call this number if you have problems the morning of surgery: [phone]   Remember:   Do not drink or eat food:After Midnight.  :  Take these medicines the morning of surgery with A SIP OF WATER: none   Do not wear jewelry, make-up or nail polish.  Do not wear lotions, powders, or perfumes. You may wear deodorant.  Do not shave 48 hours prior to surgery. Men may shave face and neck.  Do not bring valuables to the hospital.  Contacts, dentures or bridgework may not be worn into surgery.  Leave suitcase in the car. After surgery it may be brought to your room.  For patients admitted to the hospital, checkout time is 11:00 AM the day of discharge.   Patients discharged the day of surgery will not be allowed to drive home.    Special Instructions: Shower using CHG night before surgery and shower the day of surgery use CHG.  Use special wash - you have one bottle of CHG for all showers.  You should use approximately 1/2 of the bottle for each shower. Hysteroscopy, Care After Refer to this sheet in the next few weeks. These instructions provide you with information on caring for yourself after your procedure. Your health care provider may also give you more specific instructions. Your treatment has been planned according to current medical practices, but problems sometimes occur. Call your health care provider if you have any problems or questions after your procedure. What can I expect after the procedure? After your procedure, it is typical to have the following:  You may have some cramping. This normally lasts for a couple days.  You may have bleeding. This can vary from light spotting for a few days to menstrual-like bleeding for 3-7 days.  Follow these instructions at home:  Rest for the first 1-2 days after the procedure.  Only take over-the-counter or prescription medicines as directed by your  health care provider. Do not take aspirin. It can increase the chances of bleeding.  Take showers instead of baths for 2 weeks or as directed by your health care provider.  Do not drive for 24 hours or as directed.  Do not drink alcohol while taking pain medicine.  Do not use tampons, douche, or have sexual intercourse for 2 weeks or until your health care provider says it is okay.  Take your temperature twice a day for 4-5 days. Write it down each time.  Follow your health care provider's advice about diet, exercise, and lifting.  If you develop constipation, you may: ? Take a mild laxative if your health care provider approves. ? Add bran foods to your diet. ? Drink enough fluids to keep your urine clear or pale yellow.  Try to have someone with you or available to you for the first 24-48 hours, especially if you were given a general anesthetic.  Follow up with your health care provider as directed. Contact a health care provider if:  You feel dizzy or lightheaded.  You feel sick to your stomach (nauseous).  You have abnormal vaginal discharge.  You have a rash.  You have pain that is not controlled with medicine. Get help right away if:  You have bleeding that is heavier than a normal menstrual period.  You have a fever.  You have increasing cramps or pain, not controlled with medicine.  You have new belly (  abdominal) pain.  You pass out.  You have pain in the tops of your shoulders (shoulder strap areas).  You have shortness of breath. This information is not intended to replace advice given to you by your health care provider. Make sure you discuss any questions you have with your health care provider. Document Released: 10/01/2013 Document Revised: 05/18/2016 Document Reviewed: 07/10/2013 Elsevier Interactive Patient Education  2017 [employer]  Endometrial Ablation Endometrial ablation is a procedure that destroys the thin inner layer of the lining of  the uterus (endometrium). This procedure may be done:  To stop heavy periods.  To stop bleeding that is causing anemia.  To control irregular bleeding.  To treat bleeding caused by small tumors (fibroids) in the endometrium.  This procedure is often an alternative to major surgery, such as removal of the uterus and cervix (hysterectomy). As a result of this procedure:  You may not be able to have children. However, if you are premenopausal (you have not gone through menopause): ? You may still have a small chance of getting pregnant. ? You will need to use a reliable method of birth control after the procedure to prevent pregnancy.  You may stop having a menstrual period, or you may have only a small amount of bleeding during your period. Menstruation may return several years after the procedure.  Tell a health care provider about:  Any allergies you have.  All medicines you are taking, including vitamins, herbs, eye drops, creams, and over-the-counter medicines.  Any problems you or family members have had with the use of anesthetic medicines.  Any blood disorders you have.  Any surgeries you have had.  Any medical conditions you have. What are the risks? Generally, this is a safe procedure. However, problems may occur, including:  A hole (perforation) in the uterus or bowel.  Infection of the uterus, bladder, or vagina.  Bleeding.  Damage to other structures or organs.  An air bubble in the lung (air embolus).  Problems with pregnancy after the procedure.  Failure of the procedure.  Decreased ability to diagnose cancer in the endometrium.  What happens before the procedure?  You will have tests of your endometrium to make sure there are no pre-cancerous cells or cancer cells present.  You may have an ultrasound of the uterus.  You may be given medicines to thin the endometrium.  Ask your health care provider about: ? Changing or stopping your regular  medicines. This is especially important if you take diabetes medicines or blood thinners. ? Taking medicines such as aspirin and ibuprofen. These medicines can thin your blood. Do not take these medicines before your procedure if your doctor tells you not to.  Plan to have someone take you home from the hospital or clinic. What happens during the procedure?  You will lie on an exam table with your feet and legs supported as in a pelvic exam.  To lower your risk of infection: ? Your health care team will wash or sanitize their hands and put on germ-free (sterile) gloves. ? Your genital area will be washed with soap.  An IV tube will be inserted into one of your veins.  You will be given a medicine to help you relax (sedative).  A surgical instrument with a light and camera (resectoscope) will be inserted into your vagina and moved into your uterus. This allows your surgeon to see inside your uterus.  Endometrial tissue will be removed using one of the following methods: ?  Radiofrequency. This method uses a radiofrequency-alternating electric current to remove the endometrium. ? Cryotherapy. This method uses extreme cold to freeze the endometrium. ? Heated-free liquid. This method uses a heated saltwater (saline) solution to remove the endometrium. ? Microwave. This method uses high-energy microwaves to heat up the endometrium and remove it. ? Thermal balloon. This method involves inserting a catheter with a balloon tip into the uterus. The balloon tip is filled with heated fluid to remove the endometrium. The procedure may vary among health care providers and hospitals. What happens after the procedure?  Your blood pressure, heart rate, breathing rate, and blood oxygen level will be monitored until the medicines you were given have worn off.  As tissue healing occurs, you may notice vaginal bleeding for 4-6 weeks after the procedure. You may also experience: ? Cramps. ? Thin, watery  vaginal discharge that is light pink or brown in color. ? A need to urinate more frequently than usual. ? Nausea.  Do not drive for 24 hours if you were given a sedative.  Do not have sex or insert anything into your vagina until your health care provider approves. Summary  Endometrial ablation is done to treat the many causes of heavy menstrual bleeding.  The procedure may be done only after medications have been tried to control the bleeding.  Plan to have someone take you home from the hospital or clinic. This information is not intended to replace advice given to you by your health care provider. Make sure you discuss any questions you have with your health care provider. Document Released: 10/20/2004 Document Revised: 12/28/2016 Document Reviewed: 12/28/2016 Elsevier Interactive Patient Education  2017 Elsevier Inc. General Anesthesia, Adult, Care After These instructions provide you with information about caring for yourself after your procedure. Your health care provider may also give you more specific instructions. Your treatment has been planned according to current medical practices, but problems sometimes occur. Call your health care provider if you have any problems or questions after your procedure. What can I expect after the procedure? After the procedure, it is common to have:  Vomiting.  A sore throat.  Mental slowness.  It is common to feel:  Nauseous.  Cold or shivery.  Sleepy.  Tired.  Sore or achy, even in parts of your body where you did not have surgery.  Follow these instructions at home: For at least 24 hours after the procedure:  Do not: ? Participate in activities where you could fall or become injured. ? Drive. ? Use heavy machinery. ? Drink alcohol. ? Take sleeping pills or medicines that cause drowsiness. ? Make important decisions or sign legal documents. ? Take care of children on your own.  Rest. Eating and drinking  If you  vomit, drink water, juice, or soup when you can drink without vomiting.  Drink enough fluid to keep your urine clear or pale yellow.  Make sure you have little or no nausea before eating solid foods.  Follow the diet recommended by your health care provider. General instructions  Have a responsible adult stay with you until you are awake and alert.  Return to your normal activities as told by your health care provider. Ask your health care provider what activities are safe for you.  Take over-the-counter and prescription medicines only as told by your health care provider.  If you smoke, do not smoke without supervision.  Keep all follow-up visits as told by your health care provider. This is important. Contact a health  care provider if:  You continue to have nausea or vomiting at home, and medicines are not helpful.  You cannot drink fluids or start eating again.  You cannot urinate after 8-12 hours.  You develop a skin rash.  You have fever.  You have increasing redness at the site of your procedure. Get help right away if:  You have difficulty breathing.  You have chest pain.  You have unexpected bleeding.  You feel that you are having a life-threatening or urgent problem. This information is not intended to replace advice given to you by your health care provider. Make sure you discuss any questions you have with your health care provider. Document Released: 03/19/2001 Document Revised: 05/15/2016 Document Reviewed: 11/25/2015 Elsevier Interactive Patient Education  [employer].

## 2018-07-03 NOTE — Patient Instructions (Signed)
   Your procedure is scheduled on: 07/10/2018  [facility]  Call this number if you have problems the morning of surgery: [phone]   Remember:   Do not drink or eat food:After Midnight.  :  Take these medicines the morning of surgery with A SIP OF WATER: none   Do not wear jewelry, make-up or nail polish.  Do not wear lotions, powders, or perfumes. You may wear deodorant.  Do not shave 48 hours prior to surgery. Men may shave face and neck.  Do not bring valuables to the hospital.  Contacts, dentures or bridgework may not be worn into surgery.  Leave suitcase in the car. After surgery it may be brought to your room.  For patients admitted to the hospital, checkout time is 11:00 AM the day of discharge.   Patients discharged the day of surgery will not be allowed to drive home.    Special Instructions: Shower using CHG night before surgery and shower the day of surgery use CHG.  Use special wash - you have one bottle of CHG for all showers.  You should use approximately 1/2 of the bottle for each shower.

## 2018-07-04 ENCOUNTER — Encounter (HOSPITAL_COMMUNITY): Payer: Self-pay | Admitting: *Deleted

## 2018-07-04 ENCOUNTER — Encounter (HOSPITAL_COMMUNITY)
Admission: RE | Admit: 2018-07-04 | Discharge: 2018-07-04 | Disposition: A | Discharge status: Home / Self Care | Payer: 59 | Source: Ambulatory Visit | Attending: Obstetrics & Gynecology | Admitting: Obstetrics & Gynecology

## 2018-07-04 DIAGNOSIS — R310 Gross hematuria: Secondary | ICD-10-CM | POA: Diagnosis not present

## 2018-07-04 DIAGNOSIS — Z01812 Encounter for preprocedural laboratory examination: Secondary | ICD-10-CM | POA: Insufficient documentation

## 2018-07-04 DIAGNOSIS — R3129 Other microscopic hematuria: Secondary | ICD-10-CM | POA: Diagnosis not present

## 2018-07-04 LAB — COMPREHENSIVE METABOLIC PANEL
ALK PHOS: 51 U/L (ref 38–126)
ALT: 19 U/L (ref 0–44)
AST: 16 U/L (ref 15–41)
Albumin: 3.8 g/dL (ref 3.5–5.0)
Anion gap: 8 (ref 5–15)
BUN: 7 mg/dL (ref 6–20)
CALCIUM: 8.7 mg/dL — AB (ref 8.9–10.3)
CHLORIDE: 104 mmol/L (ref 98–111)
CO2: 23 mmol/L (ref 22–32)
CREATININE: 0.62 mg/dL (ref 0.44–1.00)
GFR calc Af Amer: >60 mL/min (ref >60)
GFR calc non Af Amer: >60 mL/min (ref >60)
GLUCOSE: 214 mg/dL — AB (ref 70–99)
Potassium: 3.4 mmol/L — ABNORMAL LOW (ref 3.5–5.1)
SODIUM: 135 mmol/L (ref 135–145)
Total Bilirubin: 0.3 mg/dL (ref 0.3–1.2)
Total Protein: 7.4 g/dL (ref 6.5–8.1)

## 2018-07-04 LAB — URINALYSIS, ROUTINE W REFLEX MICROSCOPIC
BACTERIA UA: NONE SEEN
Bilirubin Urine: NEGATIVE
Glucose, UA: 150 mg/dL — AB
KETONES UR: NEGATIVE mg/dL
LEUKOCYTES UA: NEGATIVE
NITRITE: NEGATIVE
Protein, ur: 30 mg/dL — AB
Specific Gravity, Urine: >1.046 — ABNORMAL HIGH (ref 1.005–1.030)
pH: 6 (ref 5.0–8.0)

## 2018-07-04 LAB — RAPID HIV SCREEN (HIV 1/2 AB+AG)
HIV 1/2 Antibodies: NONREACTIVE
HIV-1 P24 Antigen - HIV24: NONREACTIVE

## 2018-07-04 LAB — CBC
HCT: 37.4 % (ref 36.0–46.0)
HEMOGLOBIN: 12.7 g/dL (ref 12.0–15.0)
MCH: 30 pg (ref 26.0–34.0)
MCHC: 34 g/dL (ref 30.0–36.0)
MCV: 88.2 fL (ref 78.0–100.0)
PLATELETS: 269 10*3/uL (ref 150–400)
RBC: 4.24 MIL/uL (ref 3.87–5.11)
RDW: 13 % (ref 11.5–15.5)
WBC: 9.8 10*3/uL (ref 4.0–10.5)

## 2018-07-04 LAB — HCG, QUANTITATIVE, PREGNANCY: hCG, Beta Chain, Quant, S: <1 m[IU]/mL (ref <5)

## 2018-07-09 DIAGNOSIS — R600 Localized edema: Secondary | ICD-10-CM | POA: Diagnosis not present

## 2018-07-09 DIAGNOSIS — Z6841 Body Mass Index (BMI) 40.0 and over, adult: Secondary | ICD-10-CM | POA: Diagnosis not present

## 2018-07-09 DIAGNOSIS — N939 Abnormal uterine and vaginal bleeding, unspecified: Secondary | ICD-10-CM | POA: Diagnosis not present

## 2018-07-10 ENCOUNTER — Encounter (HOSPITAL_COMMUNITY)
Admission: AD | Disposition: A | Discharge status: Home / Self Care | Payer: Self-pay | Source: Ambulatory Visit | Attending: Obstetrics & Gynecology

## 2018-07-10 ENCOUNTER — Observation Stay (HOSPITAL_COMMUNITY)
Admission: AD | Admit: 2018-07-10 | Discharge: 2018-07-11 | Disposition: A | Discharge status: Home / Self Care | Payer: 59 | Source: Ambulatory Visit | Attending: Obstetrics & Gynecology | Admitting: Obstetrics & Gynecology

## 2018-07-10 ENCOUNTER — Ambulatory Visit (HOSPITAL_COMMUNITY): Payer: 59 | Admitting: Anesthesiology

## 2018-07-10 ENCOUNTER — Encounter (HOSPITAL_COMMUNITY): Payer: Self-pay | Admitting: *Deleted

## 2018-07-10 ENCOUNTER — Other Ambulatory Visit: Payer: Self-pay

## 2018-07-10 ENCOUNTER — Other Ambulatory Visit: Payer: Self-pay | Admitting: Obstetrics & Gynecology

## 2018-07-10 DIAGNOSIS — E669 Obesity, unspecified: Secondary | ICD-10-CM | POA: Diagnosis not present

## 2018-07-10 DIAGNOSIS — N938 Other specified abnormal uterine and vaginal bleeding: Secondary | ICD-10-CM | POA: Insufficient documentation

## 2018-07-10 DIAGNOSIS — N838 Other noninflammatory disorders of ovary, fallopian tube and broad ligament: Secondary | ICD-10-CM | POA: Diagnosis not present

## 2018-07-10 DIAGNOSIS — N841 Polyp of cervix uteri: Secondary | ICD-10-CM | POA: Diagnosis not present

## 2018-07-10 DIAGNOSIS — Z6841 Body Mass Index (BMI) 40.0 and over, adult: Secondary | ICD-10-CM | POA: Insufficient documentation

## 2018-07-10 DIAGNOSIS — N946 Dysmenorrhea, unspecified: Secondary | ICD-10-CM | POA: Diagnosis not present

## 2018-07-10 DIAGNOSIS — K219 Gastro-esophageal reflux disease without esophagitis: Secondary | ICD-10-CM | POA: Insufficient documentation

## 2018-07-10 DIAGNOSIS — N854 Malposition of uterus: Secondary | ICD-10-CM | POA: Insufficient documentation

## 2018-07-10 DIAGNOSIS — N921 Excessive and frequent menstruation with irregular cycle: Secondary | ICD-10-CM | POA: Diagnosis present

## 2018-07-10 DIAGNOSIS — N920 Excessive and frequent menstruation with regular cycle: Secondary | ICD-10-CM | POA: Diagnosis not present

## 2018-07-10 DIAGNOSIS — Z90710 Acquired absence of both cervix and uterus: Secondary | ICD-10-CM | POA: Diagnosis present

## 2018-07-10 HISTORY — PX: DILITATION & CURRETTAGE/HYSTROSCOPY WITH NOVASURE ABLATION: SHX5568

## 2018-07-10 HISTORY — PX: HYSTERECTOMY ABDOMINAL WITH SALPINGECTOMY: SHX6725

## 2018-07-10 LAB — GLUCOSE, CAPILLARY
GLUCOSE-CAPILLARY: 178 mg/dL — AB (ref 70–99)
Glucose-Capillary: 185 mg/dL — ABNORMAL HIGH (ref 70–99)

## 2018-07-10 SURGERY — DILATATION & CURETTAGE/HYSTEROSCOPY WITH NOVASURE ABLATION
Anesthesia: General | Site: Vagina

## 2018-07-10 MED ORDER — HEMOSTATIC AGENTS (NO CHARGE) OPTIME
TOPICAL | Status: DC | PRN
  Administered 2018-07-10: 1 via TOPICAL

## 2018-07-10 MED ORDER — FENTANYL CITRATE (PF) 100 MCG/2ML IJ SOLN
INTRAMUSCULAR | Status: DC | PRN
  Administered 2018-07-10: 50 ug via INTRAVENOUS
  Administered 2018-07-10 (×2): 100 ug via INTRAVENOUS
  Administered 2018-07-10 (×2): 50 ug via INTRAVENOUS

## 2018-07-10 MED ORDER — ONDANSETRON HCL 4 MG/2ML IJ SOLN
INTRAMUSCULAR | Status: DC | PRN
  Administered 2018-07-10: 4 mg via INTRAVENOUS

## 2018-07-10 MED ORDER — BUTORPHANOL TARTRATE 1 MG/ML IJ SOLN: 1.0000 mg | INTRAMUSCULAR | Status: DC | PRN

## 2018-07-10 MED ORDER — ZOLPIDEM TARTRATE 5 MG PO TABS: 5.0000 mg | ORAL_TABLET | Freq: Every evening | ORAL | Status: DC | PRN

## 2018-07-10 MED ORDER — LACTATED RINGERS IV SOLN
INTRAVENOUS | Status: DC | PRN
  Administered 2018-07-10 (×3): via INTRAVENOUS

## 2018-07-10 MED ORDER — KETOROLAC TROMETHAMINE 30 MG/ML IJ SOLN
30.0000 mg | Freq: Four times a day (QID) | INTRAMUSCULAR | Status: AC
  Administered 2018-07-10 – 2018-07-11 (×3): 30 mg via INTRAVENOUS
  Filled 2018-07-10 (×3): qty 1

## 2018-07-10 MED ORDER — PROPOFOL 10 MG/ML IV BOLUS
INTRAVENOUS | Status: AC
  Filled 2018-07-10: qty 40

## 2018-07-10 MED ORDER — FENTANYL CITRATE (PF) 100 MCG/2ML IJ SOLN
25.0000 ug | INTRAMUSCULAR | Status: DC | PRN
  Administered 2018-07-10 (×2): 50 ug via INTRAVENOUS

## 2018-07-10 MED ORDER — CEFAZOLIN SODIUM 10 G IJ SOLR
3.0000 g | INTRAMUSCULAR | Status: AC
  Administered 2018-07-10: 3 g via INTRAVENOUS
  Filled 2018-07-10: qty 3000

## 2018-07-10 MED ORDER — PROPOFOL 10 MG/ML IV BOLUS
INTRAVENOUS | Status: DC | PRN
  Administered 2018-07-10: 20 mg via INTRAVENOUS
  Administered 2018-07-10: 180 mg via INTRAVENOUS

## 2018-07-10 MED ORDER — FENTANYL CITRATE (PF) 100 MCG/2ML IJ SOLN
INTRAMUSCULAR | Status: AC
  Filled 2018-07-10: qty 2

## 2018-07-10 MED ORDER — ONDANSETRON HCL 4 MG PO TABS
8.0000 mg | ORAL_TABLET | Freq: Four times a day (QID) | ORAL | Status: DC | PRN
  Administered 2018-07-11 (×2): 8 mg via ORAL
  Filled 2018-07-10 (×3): qty 2

## 2018-07-10 MED ORDER — MIDAZOLAM HCL 5 MG/5ML IJ SOLN
INTRAMUSCULAR | Status: DC | PRN
  Administered 2018-07-10: 2 mg via INTRAVENOUS

## 2018-07-10 MED ORDER — DIPHENHYDRAMINE HCL 50 MG/ML IJ SOLN: 25.0000 mg | Freq: Four times a day (QID) | INTRAMUSCULAR | Status: DC | PRN

## 2018-07-10 MED ORDER — HYDROMORPHONE HCL 1 MG/ML IJ SOLN
INTRAMUSCULAR | Status: AC
Start: 2018-07-10 — End: ?
  Filled 2018-07-10: qty 0.5

## 2018-07-10 MED ORDER — ZOLPIDEM TARTRATE 5 MG PO TABS: 10.0000 mg | ORAL_TABLET | Freq: Every evening | ORAL | Status: DC | PRN

## 2018-07-10 MED ORDER — SODIUM CHLORIDE 0.9 % IR SOLN
Status: DC | PRN
  Administered 2018-07-10 (×2): 1000 mL

## 2018-07-10 MED ORDER — HYDROMORPHONE HCL 1 MG/ML IJ SOLN
0.5000 mg | INTRAMUSCULAR | Status: DC | PRN
  Administered 2018-07-10 (×3): 0.5 mg via INTRAVENOUS
  Filled 2018-07-10 (×2): qty 0.5

## 2018-07-10 MED ORDER — SUGAMMADEX SODIUM 500 MG/5ML IV SOLN
INTRAVENOUS | Status: AC
  Filled 2018-07-10: qty 5

## 2018-07-10 MED ORDER — SENNOSIDES-DOCUSATE SODIUM 8.6-50 MG PO TABS: 1.0000 | ORAL_TABLET | Freq: Every evening | ORAL | Status: DC | PRN

## 2018-07-10 MED ORDER — SUGAMMADEX SODIUM 500 MG/5ML IV SOLN
INTRAVENOUS | Status: DC | PRN
  Administered 2018-07-10: 400 mg via INTRAVENOUS

## 2018-07-10 MED ORDER — LEVOFLOXACIN IN D5W 750 MG/150ML IV SOLN
750.0000 mg | Freq: Once | INTRAVENOUS | Status: AC
  Administered 2018-07-10: 750 mg via INTRAVENOUS
  Filled 2018-07-10: qty 150

## 2018-07-10 MED ORDER — LIDOCAINE HCL 1 % IJ SOLN
INTRAMUSCULAR | Status: DC | PRN
  Administered 2018-07-10: 35 mg via INTRADERMAL

## 2018-07-10 MED ORDER — SODIUM CHLORIDE 0.9 % IV SOLN
8.0000 mg | Freq: Four times a day (QID) | INTRAVENOUS | Status: DC | PRN
  Filled 2018-07-10: qty 4

## 2018-07-10 MED ORDER — ALPRAZOLAM 0.5 MG PO TABS: 0.5000 mg | ORAL_TABLET | Freq: Three times a day (TID) | ORAL | Status: DC | PRN

## 2018-07-10 MED ORDER — HYDROMORPHONE HCL 1 MG/ML IJ SOLN
1.0000 mg | INTRAMUSCULAR | Status: DC | PRN
  Administered 2018-07-10 – 2018-07-11 (×7): 1 mg via INTRAVENOUS
  Filled 2018-07-10 (×8): qty 1

## 2018-07-10 MED ORDER — FENTANYL CITRATE (PF) 100 MCG/2ML IJ SOLN
INTRAMUSCULAR | Status: AC
  Filled 2018-07-10: qty 4

## 2018-07-10 MED ORDER — MIDAZOLAM HCL 2 MG/2ML IJ SOLN
INTRAMUSCULAR | Status: AC
  Filled 2018-07-10: qty 2

## 2018-07-10 MED ORDER — BISACODYL 10 MG RE SUPP
10.0000 mg | Freq: Every day | RECTAL | Status: DC | PRN
Start: 2018-07-10 — End: 2018-07-11

## 2018-07-10 MED ORDER — ROCURONIUM BROMIDE 100 MG/10ML IV SOLN
INTRAVENOUS | Status: DC | PRN
  Administered 2018-07-10: 10 mg via INTRAVENOUS
  Administered 2018-07-10: 40 mg via INTRAVENOUS
  Administered 2018-07-10: 10 mg via INTRAVENOUS

## 2018-07-10 MED ORDER — 0.9 % SODIUM CHLORIDE (POUR BTL) OPTIME
TOPICAL | Status: DC | PRN
  Administered 2018-07-10: 2000 mL
  Administered 2018-07-10: 1000 mL

## 2018-07-10 MED ORDER — EPINEPHRINE PF 1 MG/ML IJ SOLN
INTRAMUSCULAR | Status: AC
  Filled 2018-07-10: qty 1

## 2018-07-10 MED ORDER — BUPIVACAINE LIPOSOME 1.3 % IJ SUSP
INTRAMUSCULAR | Status: AC
  Filled 2018-07-10: qty 20

## 2018-07-10 MED ORDER — POTASSIUM CHLORIDE IN NACL 20-0.45 MEQ/L-% IV SOLN
INTRAVENOUS | Status: DC
  Administered 2018-07-10 – 2018-07-11 (×3): via INTRAVENOUS
  Filled 2018-07-10 (×4): qty 1000

## 2018-07-10 MED ORDER — DOCUSATE SODIUM 100 MG PO CAPS
100.0000 mg | ORAL_CAPSULE | Freq: Two times a day (BID) | ORAL | Status: DC
  Administered 2018-07-10 – 2018-07-11 (×2): 100 mg via ORAL
  Filled 2018-07-10 (×2): qty 1

## 2018-07-10 MED ORDER — ALUM & MAG HYDROXIDE-SIMETH 200-200-20 MG/5ML PO SUSP: 30.0000 mL | ORAL | Status: DC | PRN

## 2018-07-10 MED ORDER — ROCURONIUM BROMIDE 50 MG/5ML IV SOLN
INTRAVENOUS | Status: AC
  Filled 2018-07-10: qty 1

## 2018-07-10 MED ORDER — KETOROLAC TROMETHAMINE 30 MG/ML IJ SOLN
30.0000 mg | Freq: Once | INTRAMUSCULAR | Status: AC
  Administered 2018-07-10: 30 mg via INTRAVENOUS
  Filled 2018-07-10: qty 1

## 2018-07-10 MED ORDER — PROMETHAZINE HCL 25 MG/ML IJ SOLN
25.0000 mg | Freq: Four times a day (QID) | INTRAMUSCULAR | Status: DC | PRN
  Administered 2018-07-10 – 2018-07-11 (×2): 25 mg via INTRAVENOUS
  Filled 2018-07-10 (×2): qty 1

## 2018-07-10 MED ORDER — BUPIVACAINE LIPOSOME 1.3 % IJ SUSP
INTRAMUSCULAR | Status: DC | PRN
  Administered 2018-07-10: 20 mL

## 2018-07-10 MED ORDER — OXYCODONE-ACETAMINOPHEN 7.5-325 MG PO TABS
1.0000 | ORAL_TABLET | ORAL | Status: DC | PRN
  Administered 2018-07-11: 2 via ORAL
  Filled 2018-07-10: qty 2

## 2018-07-10 MED ORDER — SUCCINYLCHOLINE CHLORIDE 20 MG/ML IJ SOLN
INTRAMUSCULAR | Status: DC | PRN
  Administered 2018-07-10: 180 mg via INTRAVENOUS

## 2018-07-10 SURGICAL SUPPLY — 55 items
ADH SKN CLS APL DERMABOND .7 (GAUZE/BANDAGES/DRESSINGS) ×2
CLOTH BEACON ORANGE TIMEOUT ST (SAFETY) ×3 IMPLANT
COVER LIGHT HANDLE STERIS (MISCELLANEOUS) ×8 IMPLANT
DERMABOND ADVANCED (GAUZE/BANDAGES/DRESSINGS) ×1
DERMABOND ADVANCED .7 DNX12 (GAUZE/BANDAGES/DRESSINGS) IMPLANT
DRSG OPSITE POSTOP 4X8 (GAUZE/BANDAGES/DRESSINGS) ×1 IMPLANT
GAUZE SPONGE 4X4 12PLY STRL (GAUZE/BANDAGES/DRESSINGS) ×3 IMPLANT
GAUZE SPONGE 4X4 16PLY XRAY LF (GAUZE/BANDAGES/DRESSINGS) ×3 IMPLANT
GLOVE BIOGEL PI IND STRL 7.0 (GLOVE) ×4 IMPLANT
GLOVE BIOGEL PI IND STRL 8 (GLOVE) ×2 IMPLANT
GLOVE BIOGEL PI INDICATOR 7.0 (GLOVE) ×6
GLOVE BIOGEL PI INDICATOR 8 (GLOVE) ×2
GLOVE ECLIPSE 6.5 STRL STRAW (GLOVE) ×3 IMPLANT
GLOVE ECLIPSE 8.0 STRL XLNG CF (GLOVE) ×4 IMPLANT
GOWN STRL REUS W/TWL LRG LVL3 (GOWN DISPOSABLE) ×4 IMPLANT
GOWN STRL REUS W/TWL XL LVL3 (GOWN DISPOSABLE) ×5 IMPLANT
HANDPIECE ABLA MINERVA ENDO (MISCELLANEOUS) ×3 IMPLANT
HEMOSTAT ARISTA ABSORB 3G PWDR (MISCELLANEOUS) ×1 IMPLANT
INST SET HYSTEROSCOPY (KITS) ×3 IMPLANT
IV NS 1000ML (IV SOLUTION) ×9
IV NS 1000ML BAXH (IV SOLUTION) ×2 IMPLANT
KIT TURNOVER CYSTO (KITS) ×3 IMPLANT
MANIFOLD NEPTUNE II (INSTRUMENTS) ×3 IMPLANT
MARKER SKIN DUAL TIP RULER LAB (MISCELLANEOUS) ×3 IMPLANT
NDL HYPO 21X1.5 SAFETY (NEEDLE) IMPLANT
NEEDLE HYPO 21X1.5 SAFETY (NEEDLE) ×3 IMPLANT
NS IRRIG 1000ML POUR BTL (IV SOLUTION) ×5 IMPLANT
PACK ABDOMINAL MAJOR (CUSTOM PROCEDURE TRAY) ×1 IMPLANT
PACK BASIC III (CUSTOM PROCEDURE TRAY) ×3
PACK SRG BSC III STRL LF ECLPS (CUSTOM PROCEDURE TRAY) ×2 IMPLANT
PAD ABD 5X9 TENDERSORB (GAUZE/BANDAGES/DRESSINGS) ×2 IMPLANT
PAD ARMBOARD 7.5X6 YLW CONV (MISCELLANEOUS) ×3 IMPLANT
PAD TELFA 3X4 1S STER (GAUZE/BANDAGES/DRESSINGS) ×3 IMPLANT
RETRACTOR WND ALEXIS 25 LRG (MISCELLANEOUS) IMPLANT
RTRCTR WOUND ALEXIS 25CM LRG (MISCELLANEOUS) ×3
SET BASIN LINEN APH (SET/KITS/TRAYS/PACK) ×3 IMPLANT
SET IRRIG Y TYPE TUR BLADDER L (SET/KITS/TRAYS/PACK) ×3 IMPLANT
SHEET LAVH (DRAPES) ×3 IMPLANT
SPONGE LAP 18X18 X RAY DECT (DISPOSABLE) ×1 IMPLANT
STAPLER VISISTAT 35W (STAPLE) ×1 IMPLANT
SUT CHROMIC 0 CT 1 (SUTURE) ×1 IMPLANT
SUT MON AB 3-0 SH 27 (SUTURE) ×3 IMPLANT
SUT PLAIN 2 0 XLH (SUTURE) ×1 IMPLANT
SUT VIC AB 0 CT1 27 (SUTURE) ×9
SUT VIC AB 0 CT1 27XCR 8 STRN (SUTURE) IMPLANT
SUT VIC AB 0 CTX 36 (SUTURE) ×3
SUT VIC AB 0 CTX36XBRD ANTBCTR (SUTURE) IMPLANT
SUT VICRYL 3 0 (SUTURE) ×1 IMPLANT
SYR 20CC LL (SYRINGE) ×1 IMPLANT
TAPE PAPER 2X10 WHT MICROPORE (GAUZE/BANDAGES/DRESSINGS) ×1 IMPLANT
TOWEL BLUE STERILE X RAY DET (MISCELLANEOUS) ×1 IMPLANT
TRAY FOLEY MTR SLVR 16FR STAT (SET/KITS/TRAYS/PACK) ×1 IMPLANT
TRAY FOLEY W/BAG SLVR 16FR (SET/KITS/TRAYS/PACK) ×3
TRAY FOLEY W/BAG SLVR 16FR ST (SET/KITS/TRAYS/PACK) IMPLANT
YANKAUER SUCT BULB TIP 10FT TU (MISCELLANEOUS) ×3 IMPLANT

## 2018-07-10 NOTE — Transfer of Care (Signed)
Immediate Anesthesia Transfer of Care Note  Patient: Latoya Espinoza  Procedure(s) Performed: failed hysteroscopy dilation and curettage endometrial ablation (N/A Vagina ) HYSTERECTOMY ABDOMINAL WITH SALPINGECTOMY (Bilateral Abdomen)  Patient Location: PACU  Anesthesia Type:General  Level of Consciousness: awake and patient cooperative  Airway & Oxygen Therapy: Patient Spontanous Breathing and Patient connected to face mask oxygen  Post-op Assessment: Report given to RN, Post -op Vital signs reviewed and stable and Patient moving all extremities  Post vital signs: Reviewed and stable  Last Vitals:  Vitals Value Taken Time  BP    Temp    Pulse 73 07/10/2018 11:10 AM  Resp 17 07/10/2018 11:10 AM  SpO2 100 % 07/10/2018 11:10 AM  Vitals shown include unvalidated device data.  Last Pain:  Vitals:   07/10/18 0706  TempSrc:   PainSc: 0-No pain         Complications: No apparent anesthesia complications

## 2018-07-10 NOTE — Anesthesia Postprocedure Evaluation (Signed)
Anesthesia Post Note  Patient: Latoya Espinoza  Procedure(s) Performed: FAILED HYSTEROSCOPY, DILATION AND CURETTAGE AND ENDOMETRIAL ABLATION (N/A Vagina ) HYSTERECTOMY ABDOMINAL WITH BILATERAL SALPINGECTOMY (Bilateral Abdomen)  Patient location during evaluation: PACU Anesthesia Type: General Level of consciousness: awake and patient cooperative Pain management: pain level controlled Vital Signs Assessment: post-procedure vital signs reviewed and stable Respiratory status: spontaneous breathing, nonlabored ventilation and respiratory function stable Cardiovascular status: blood pressure returned to baseline Postop Assessment: no apparent nausea or vomiting Anesthetic complications: no     Last Vitals:  Vitals:   07/10/18 1257 07/10/18 1313  BP:  123/78  Pulse: 78 78  Resp: 16 18  Temp:  36.9 C  SpO2:  100%    Last Pain:  Vitals:   07/10/18 1313  TempSrc: Oral  PainSc:                  Trevyn Lumpkin J

## 2018-07-10 NOTE — Anesthesia Preprocedure Evaluation (Signed)
Anesthesia Evaluation  Patient identified by MRN, date of birth, ID band Patient awake    Reviewed: Allergy & Precautions, NPO status , Patient's Chart, lab work & pertinent test results  Airway Mallampati: II  TM Distance: >3 FB Neck ROM: Full    Dental no notable dental hx.    Pulmonary neg pulmonary ROS,  Snores -denies OSA -never formally tested   Pulmonary exam normal breath sounds clear to auscultation       Cardiovascular Exercise Tolerance: Good negative cardio ROS Normal cardiovascular exam Rhythm:Regular Rate:Normal  Increased BMI states has occ DOE never at rest  States from being deconditioned  Never sees cardiologist , denies CP   Neuro/Psych negative neurological ROS  negative psych ROS   GI/Hepatic negative GI ROS, Neg liver ROS, GERD  ,Only OTCs- min Sx today -will do GETA    Endo/Other  negative endocrine ROS  Renal/GU negative Renal ROS  negative genitourinary   Musculoskeletal negative musculoskeletal ROS (+) Arthritis , Osteoarthritis,    Abdominal   Peds negative pediatric ROS (+)  Hematology negative hematology ROS (+)   Anesthesia Other Findings   Reproductive/Obstetrics negative OB ROS                             Anesthesia Physical Anesthesia Plan  ASA: II  Anesthesia Plan: General   Post-op Pain Management:    Induction: Intravenous  PONV Risk Score and Plan:   Airway Management Planned: Oral ETT  Additional Equipment:   Intra-op Plan:   Post-operative Plan: Extubation in OR  Informed Consent: I have reviewed the patients History and Physical, chart, labs and discussed the procedure including the risks, benefits and alternatives for the proposed anesthesia with the patient or authorized representative who has indicated his/her understanding and acceptance.   Dental advisory given  Plan Discussed with: CRNA  Anesthesia Plan Comments:          Anesthesia Quick Evaluation

## 2018-07-10 NOTE — Anesthesia Procedure Notes (Signed)
Procedure Name: Intubation Date/Time: 07/10/2018 7:45 AM Performed by: Charmaine Downs, CRNA Pre-anesthesia Checklist: Patient identified, Patient being monitored, Timeout performed, Emergency Drugs available and Suction available Patient Re-evaluated:Patient Re-evaluated prior to induction Oxygen Delivery Method: Circle System Utilized Preoxygenation: Pre-oxygenation with 100% oxygen Induction Type: IV induction, Cricoid Pressure applied and Rapid sequence Ventilation: Mask ventilation without difficulty Laryngoscope Size: Mac and 4 Grade View: Grade II Tube type: Oral Tube size: 7.0 mm Number of attempts: 1 Airway Equipment and Method: stylet Placement Confirmation: ETT inserted through vocal cords under direct vision,  positive ETCO2 and breath sounds checked- equal and bilateral Secured at: 21 cm Tube secured with: Tape Dental Injury: Teeth and Oropharynx as per pre-operative assessment

## 2018-07-10 NOTE — H&P (Signed)
Preoperative History and Physical  Latoya Espinoza is a 42 y.o. [id number] with No LMP recorded. admitted for a hysteroscopy uterine curettage Minerva endometrial ablation.  She has been having prolonged heavy vaginal for over a year which has been unresponsive to all measure including high dose megestrol therapy, currently she is bleeding 39 days consecutively.  Her sonogram is normal She is appropriate for ablation  PMH:    Past Medical History:  Diagnosis Date  . Arthritis   . GERD (gastroesophageal reflux disease)   . Obesity     PSH:     Past Surgical History:  Procedure Laterality Date  . CHOLECYSTECTOMY      POb/GynH:      OB History    Gravida  2   Para  2   Term  2   Preterm      AB      Living  2     SAB      TAB      Ectopic      Multiple      Live Births              SH:   Social History   Tobacco Use  . Smoking status: Never Smoker  . Smokeless tobacco: Never Used  Substance Use Topics  . Alcohol use: No  . Drug use: No    FH:    Family History  Problem Relation Age of Onset  . Cancer Mother        stage 4 lung cancer     Allergies:  Allergies  Allergen Reactions  . Dilaudid [Hydromorphone Hcl] Hives    Medications:       Current Facility-Administered Medications:  .  ceFAZolin (ANCEF) 3 g in dextrose 5 % 50 mL IVPB, 3 g, Intravenous, On Call to OR, Lazaro Arms, MD .  ketorolac (TORADOL) 30 MG/ML injection 30 mg, 30 mg, Intravenous, Once, Lazaro Arms, MD  Review of Systems:   Review of Systems  Constitutional: Negative for fever, chills, weight loss, malaise/fatigue and diaphoresis.  HENT: Negative for hearing loss, ear pain, nosebleeds, congestion, sore throat, neck pain, tinnitus and ear discharge.   Eyes: Negative for blurred vision, double vision, photophobia, pain, discharge and redness.  Respiratory: Negative for cough, hemoptysis, sputum production, shortness of breath, wheezing and stridor.    Cardiovascular: Negative for chest pain, palpitations, orthopnea, claudication, leg swelling and PND.  Gastrointestinal: Positive for abdominal pain. Negative for heartburn, nausea, vomiting, diarrhea, constipation, blood in stool and melena.  Genitourinary: Negative for dysuria, urgency, frequency, hematuria and flank pain.  Musculoskeletal: Negative for myalgias, back pain, joint pain and falls.  Skin: Negative for itching and rash.  Neurological: Negative for dizziness, tingling, tremors, sensory change, speech change, focal weakness, seizures, loss of consciousness, weakness and headaches.  Endo/Heme/Allergies: Negative for environmental allergies and polydipsia. Does not bruise/bleed easily.  Psychiatric/Behavioral: Negative for depression, suicidal ideas, hallucinations, memory loss and substance abuse. The patient is not nervous/anxious and does not have insomnia.      PHYSICAL EXAM:  Blood pressure 120/84, pulse 73, temperature 98.2 F (36.8 C), temperature source Oral, resp. rate 18, SpO2 99 %.    Vitals reviewed. Constitutional: She is oriented to person, place, and time. She appears well-developed and well-nourished.  HENT:  Head: Normocephalic and atraumatic.  Right Ear: External ear normal.  Left Ear: External ear normal.  Nose: Nose normal.  Mouth/Throat: Oropharynx is clear and moist.  Eyes: Conjunctivae and EOM  are normal. Pupils are equal, round, and reactive to light. Right eye exhibits no discharge. Left eye exhibits no discharge. No scleral icterus.  Neck: Normal range of motion. Neck supple. No tracheal deviation present. No thyromegaly present.  Cardiovascular: Normal rate, regular rhythm, normal heart sounds and intact distal pulses.  Exam reveals no gallop and no friction rub.   No murmur heard. Respiratory: Effort normal and breath sounds normal. No respiratory distress. She has no wheezes. She has no rales. She exhibits no tenderness.  GI: Soft. Bowel sounds  are normal. She exhibits no distension and no mass. There is tenderness. There is no rebound and no guarding.  Genitourinary:       Vulva is normal without lesions Vagina is pink moist without discharge Cervix normal in appearance and pap is normal Uterus is normal size, contour, position, consistency, mobility, non-tender Adnexa is negative with normal sized ovaries by sonogram  Musculoskeletal: Normal range of motion. She exhibits no edema and no tenderness.  Neurological: She is alert and oriented to person, place, and time. She has normal reflexes. She displays normal reflexes. No cranial nerve deficit. She exhibits normal muscle tone. Coordination normal.  Skin: Skin is warm and dry. No rash noted. No erythema. No pallor.  Psychiatric: She has a normal mood and affect. Her behavior is normal. Judgment and thought content normal.    Labs: Results for orders placed or performed during the hospital encounter of 07/10/18 (from the past 336 hour(s))  Glucose, capillary   Collection Time: 07/10/18  6:43 AM  Result Value Ref Range   Glucose-Capillary 185 (H) 70 - 99 mg/dL  Results for orders placed or performed during the hospital encounter of 07/04/18 (from the past 336 hour(s))  CBC   Collection Time: 07/04/18  2:01 PM  Result Value Ref Range   WBC 9.8 4.0 - 10.5 K/uL   RBC 4.24 3.87 - 5.11 MIL/uL   Hemoglobin 12.7 12.0 - 15.0 g/dL   HCT 69.637.4 29.536.0 - 28.446.0 %   MCV 88.2 78.0 - 100.0 fL   MCH 30.0 26.0 - 34.0 pg   MCHC 34.0 30.0 - 36.0 g/dL   RDW 13.213.0 44.011.5 - 10.215.5 %   Platelets 269 150 - 400 K/uL  Comprehensive metabolic panel   Collection Time: 07/04/18  2:01 PM  Result Value Ref Range   Sodium 135 135 - 145 mmol/L   Potassium 3.4 (L) 3.5 - 5.1 mmol/L   Chloride 104 98 - 111 mmol/L   CO2 23 22 - 32 mmol/L   Glucose, Bld 214 (H) 70 - 99 mg/dL   BUN 7 6 - 20 mg/dL   Creatinine, Ser 7.250.62 0.44 - 1.00 mg/dL   Calcium 8.7 (L) 8.9 - 10.3 mg/dL   Total Protein 7.4 6.5 - 8.1 g/dL    Albumin 3.8 3.5 - 5.0 g/dL   AST 16 15 - 41 U/L   ALT 19 0 - 44 U/L   Alkaline Phosphatase 51 38 - 126 U/L   Total Bilirubin 0.3 0.3 - 1.2 mg/dL   GFR calc non Af Amer >60 >60 mL/min   GFR calc Af Amer >60 >60 mL/min   Anion gap 8 5 - 15  hCG, quantitative, pregnancy   Collection Time: 07/04/18  2:01 PM  Result Value Ref Range   hCG, Beta Chain, Quant, S <1 <5 mIU/mL  Rapid HIV screen (HIV 1/2 Ab+Ag)   Collection Time: 07/04/18  2:01 PM  Result Value Ref Range   HIV-1 P24  Antigen - HIV24 NON REACTIVE NON REACTIVE   HIV 1/2 Antibodies NON REACTIVE NON REACTIVE   Interpretation (HIV Ag Ab)      A non reactive test result means that HIV 1 or HIV 2 antibodies and HIV 1 p24 antigen were not detected in the specimen.  Urinalysis, Routine w reflex microscopic   Collection Time: 07/04/18  2:01 PM  Result Value Ref Range   Color, Urine YELLOW YELLOW   APPearance CLEAR CLEAR   Specific Gravity, Urine >1.046 (H) 1.005 - 1.030   pH 6.0 5.0 - 8.0   Glucose, UA 150 (A) NEGATIVE mg/dL   Hgb urine dipstick LARGE (A) NEGATIVE   Bilirubin Urine NEGATIVE NEGATIVE   Ketones, ur NEGATIVE NEGATIVE mg/dL   Protein, ur 30 (A) NEGATIVE mg/dL   Nitrite NEGATIVE NEGATIVE   Leukocytes, UA NEGATIVE NEGATIVE   RBC / HPF >50 (H) 0 - 5 RBC/hpf   WBC, UA 0-5 0 - 5 WBC/hpf   Bacteria, UA NONE SEEN NONE SEEN   Squamous Epithelial / LPF 6-10 0 - 5    EKG: Orders placed or performed during the hospital encounter of 02/05/18  . EKG 12-Lead  . EKG 12-Lead  . ED EKG within 10 minutes  . ED EKG within 10 minutes    Imaging Studies: No results found.    Assessment  Menometrorrhagia/DUB unresponsive to megestrol therapy dysmenorrhea  Patient Active Problem List   Diagnosis Date Noted  . Amenorrhea, secondary 06/07/2017    Plan: Hysteroscopy uterine curettage endometrial ablation  Lazaro Arms 07/10/2018 7:08 AM

## 2018-07-11 ENCOUNTER — Encounter (HOSPITAL_COMMUNITY): Payer: Self-pay | Admitting: Obstetrics & Gynecology

## 2018-07-11 DIAGNOSIS — N938 Other specified abnormal uterine and vaginal bleeding: Secondary | ICD-10-CM | POA: Diagnosis not present

## 2018-07-11 DIAGNOSIS — N921 Excessive and frequent menstruation with irregular cycle: Secondary | ICD-10-CM

## 2018-07-11 DIAGNOSIS — N854 Malposition of uterus: Secondary | ICD-10-CM | POA: Diagnosis not present

## 2018-07-11 LAB — BASIC METABOLIC PANEL
ANION GAP: 7 (ref 5–15)
BUN: 7 mg/dL (ref 6–20)
CHLORIDE: 105 mmol/L (ref 98–111)
CO2: 24 mmol/L (ref 22–32)
Calcium: 7.9 mg/dL — ABNORMAL LOW (ref 8.9–10.3)
Creatinine, Ser: 0.66 mg/dL (ref 0.44–1.00)
GFR calc Af Amer: >60 mL/min (ref >60)
Glucose, Bld: 157 mg/dL — ABNORMAL HIGH (ref 70–99)
POTASSIUM: 4.1 mmol/L (ref 3.5–5.1)
SODIUM: 136 mmol/L (ref 135–145)

## 2018-07-11 LAB — CBC
HCT: 32.8 % — ABNORMAL LOW (ref 36.0–46.0)
HEMOGLOBIN: 10.8 g/dL — AB (ref 12.0–15.0)
MCH: 29.8 pg (ref 26.0–34.0)
MCHC: 32.9 g/dL (ref 30.0–36.0)
MCV: 90.6 fL (ref 78.0–100.0)
PLATELETS: 212 10*3/uL (ref 150–400)
RBC: 3.62 MIL/uL — AB (ref 3.87–5.11)
RDW: 13.4 % (ref 11.5–15.5)
WBC: 9.7 10*3/uL (ref 4.0–10.5)

## 2018-07-11 MED ORDER — OXYCODONE-ACETAMINOPHEN 7.5-325 MG PO TABS: 1.0000 | ORAL_TABLET | ORAL | 0 refills | Status: DC | PRN

## 2018-07-11 MED ORDER — KETOROLAC TROMETHAMINE 10 MG PO TABS: 10.0000 mg | ORAL_TABLET | Freq: Three times a day (TID) | ORAL | 0 refills | Status: DC | PRN

## 2018-07-11 MED ORDER — PROMETHAZINE HCL 25 MG PO TABS: 25.0000 mg | ORAL_TABLET | Freq: Four times a day (QID) | ORAL | 1 refills | Status: DC | PRN

## 2018-07-11 NOTE — Discharge Instructions (Signed)
Abdominal Hysterectomy, Care After °This sheet gives you information about how to care for yourself after your procedure. Your health care provider may also give you more specific instructions. If you have problems or questions, contact your health care provider. °What can I expect after the procedure? °After your procedure, it is common to have: °· Pain. °· Fatigue. °· Poor appetite. °· Less interest in sex. °· Vaginal bleeding and discharge. You may need to use a sanitary napkin after this procedure. ° °Follow these instructions at home: °Bathing °· Do not take baths, swim, or use a hot tub until your health care provider approves. Ask your health care provider if you can take showers. You may only be allowed to take sponge baths for bathing. °· Keep the bandage (dressing) dry until your health care provider says it can be removed. °Incision care °· Follow instructions from your health care provider about how to take care of your incision. Make sure you: °? Wash your hands with soap and water before you change your bandage (dressing). If soap and water are not available, use hand sanitizer. °? Change your dressing as told by your health care provider. °? Leave stitches (sutures), skin glue, or adhesive strips in place. These skin closures may need to stay in place for 2 weeks or longer. If adhesive strip edges start to loosen and curl up, you may trim the loose edges. Do not remove adhesive strips completely unless your health care provider tells you to do that. °· Check your incision area every day for signs of infection. Check for: °? Redness, swelling, or pain. °? Fluid or blood. °? Warmth. °? Pus or a bad smell. °Activity °· Do gentle, daily exercises as told by your health care provider. You may be told to take short walks every day and go farther each time. °· Do not lift anything that is heavier than 10 lb (4.5 kg), or the limit that your health care provider tells you, until he or she says that it is  safe. °· Do not drive or use heavy machinery while taking prescription pain medicine. °· Do not drive for 24 hours if you were given a medicine to help you relax (sedative). °· Follow your health care provider's instructions about exercise, driving, and general activities. Ask your health care provider what activities are safe for you. °Lifestyle °· Do not douche, use tampons, or have sex for at least 6 weeks or as told by your health care provider. °· Do not drink alcohol until your health care provider approves. °· Drink enough fluid to keep your urine clear or pale yellow. °· Try to have someone at home with you for the first 1-2 weeks to help. °· Do not use any products that contain nicotine or tobacco, such as cigarettes and e-cigarettes. These can delay healing. If you need help quitting, ask your health care provider. °General instructions °· Take over-the-counter and prescription medicines only as told by your health care provider. °· Do not take aspirin or ibuprofen. These medicines can cause bleeding. °· To prevent or treat constipation while you are taking prescription pain medicine, your health care provider may recommend that you: °? Drink enough fluid to keep your urine clear or pale yellow. °? Take over-the-counter or prescription medicines. °? Eat foods that are high in fiber, such as fresh fruits and vegetables, whole grains, and beans. °? Limit foods that are high in fat and processed sugars, such as fried and sweet foods. °· Keep all   follow-up visits as told by your health care provider. This is important. °Contact a health care provider if: °· You have chills or fever. °· You have redness, swelling, or pain around your incision. °· You have fluid or blood coming from your incision. °· Your incision feels warm to the touch. °· You have pus or a bad smell coming from your incision. °· Your incision breaks open. °· You feel dizzy or light-headed. °· You have pain or bleeding when you urinate. °· You  have persistent diarrhea. °· You have persistent nausea and vomiting. °· You have abnormal vaginal discharge. °· You have a rash. °· You have any type of abnormal reaction or you develop an allergy to your medicine. °· Your pain medicine does not help. °Get help right away if: °· You have a fever and your symptoms suddenly get worse. °· You have severe abdominal pain. °· You have shortness of breath. °· You faint. °· You have pain, swelling, or redness in your leg. °· You have heavy vaginal bleeding with blood clots. °Summary °· After your procedure, it is common to have pain, fatigue and vaginal discharge. °· Do not take baths, swim, or use a hot tub until your health care provider approves. Ask your health care provider if you can take showers. You may only be allowed to take sponge baths for bathing. °· Follow your health care provider's instructions about exercise, driving, and general activities. Ask your health care provider what activities are safe for you. °· Do not lift anything that is heavier than 10 lb (4.5 kg), or the limit that your health care provider tells you, until he or she says that it is safe. °· Try to have someone at home with you for the first 1-2 weeks to help. °This information is not intended to replace advice given to you by your health care provider. Make sure you discuss any questions you have with your health care provider. °Document Released: 06/30/2005 Document Revised: 11/29/2016 Document Reviewed: 11/29/2016 °Elsevier Interactive Patient Education © 2017 [employer] ° °

## 2018-07-11 NOTE — Progress Notes (Signed)
Patient ambulated approximately 100 ft in the hallway with nurse and husband. Patient tolerated well. Patient did complain of discomfort while walking but tolerated well considering. Patient is now in bed and has been medicated with prn pain meds. Will continue to monitor.

## 2018-07-11 NOTE — Discharge Summary (Signed)
Physician Discharge Summary  Patient ID: Latoya Espinoza MRN: [id number] DOB/AGE: [birth year] 42 y.o.  Admit date: 07/10/2018 Discharge date: 07/11/2018  Admission Diagnoses: S/p abdominal hysterectomy Discharge Diagnoses:  Active Problems:   S/P hysterectomy   Discharged Condition: good  Hospital Course: unremarkable post op course, refer to op note for other details  Consults:   Significant Diagnostic Studies:   Treatments: failed hsyteroscopy uterine curettage endometrial ablation, abdominal hysterectomy with removal of tubes  Discharge Exam: Blood pressure (!) 105/58, pulse 66, temperature 98.8 F (37.1 C), temperature source Oral, resp. rate 18, height 5\' 5"  (1.651 m), weight 264 lb 8.8 oz (120 kg), last menstrual period 05/31/2018, SpO2 100 %. General appearance: alert, cooperative and no distress GI: soft, non-tender; bowel sounds normal; no masses,  no organomegaly Incision/Wound:clean dry intact  Disposition: Discharge disposition: 01-Home or Self Care       Discharge Instructions    Call MD for:  persistant nausea and vomiting   Complete by:  As directed    Call MD for:  severe uncontrolled pain   Complete by:  As directed    Call MD for:  temperature >100.4   Complete by:  As directed    Diet - low sodium heart healthy   Complete by:  As directed    Driving Restrictions   Complete by:  As directed    Do not drive for 10 days   Increase activity slowly   Complete by:  As directed    Leave dressing on - Keep it clean, dry, and intact until clinic visit   Complete by:  As directed    Lifting restrictions   Complete by:  As directed    Do not lift more than 10 pounds for 6 weeks   Sexual Activity Restrictions   Complete by:  As directed    Ummm... You're kidding right?     Allergies as of 07/11/2018      Reactions   Dilaudid [hydromorphone Hcl] Hives   Per Thayer Ohm RN, patient states can tolerate dilaudid but can get some itching. Takes benadryl  if needed for that      Medication List    STOP taking these medications   medroxyPROGESTERone 10 MG tablet Commonly known as:  PROVERA   megestrol 40 MG tablet Commonly known as:  MEGACE   norgestimate-ethinyl estradiol 0.25-35 MG-MCG tablet Commonly known as:  ORTHO-CYCLEN,SPRINTEC,PREVIFEM     TAKE these medications   ALPRAZolam 0.5 MG tablet Commonly known as:  XANAX Take one tablet before each flight   ketorolac 10 MG tablet Commonly known as:  TORADOL Take 1 tablet (10 mg total) by mouth every 8 (eight) hours as needed.   meloxicam 15 MG tablet Commonly known as:  MOBIC Take 15 mg by mouth daily as needed for pain.   oxyCODONE-acetaminophen 7.5-325 MG tablet Commonly known as:  PERCOCET Take 1-2 tablets by mouth every 4 (four) hours as needed for moderate pain.   promethazine 25 MG tablet Commonly known as:  PHENERGAN Take 1 tablet (25 mg total) by mouth every 6 (six) hours as needed for nausea.   Vitamin D3 2000 units Tabs Take 2,000 Units by mouth daily.      Follow-up Information    Lazaro Arms, MD Follow up on 07/18/2018.   Specialties:  Obstetrics and Gynecology, Radiology Why:  post op appt Contact information: [address] [place] [address] [phone]           Signed: Omelia Blackwater  H Townsend Cudworth 07/11/2018, 1:19 PM

## 2018-07-11 NOTE — Progress Notes (Signed)
Patient discharged home today per MD orders. Patient vital signs WDL. IV removed and site WDL. Discharge Instructions including follow up appointments, medications, and education reviewed with patient. Patient verbalizes understanding. Patient is transported out via wheelchair.  

## 2018-07-15 ENCOUNTER — Encounter: Payer: Self-pay | Admitting: Obstetrics & Gynecology

## 2018-07-15 NOTE — Op Note (Signed)
Preoperative diagnosis: Menometrorrhagia/dysfunctional uterine bleeding unresponsive to high-dose megestrol therapy  Postoperative diagnosis: Same as above plus severely anteverted and anteflexed uterus resulting in inability to perform, the planned hysteroscopy D&C endometrial ablation   Procedure: Abdominal hysterectomy, total , with removal of both fallopian tubes after a  failed hysteroscopy uterine curettage and endometrial ablation  Surgeon:Brian Kocourek Lauretta Chester, MD  Anesthesia: General endotracheal   Findings: Patient had a very difficult to dilate cervix and required pediatric dilators Because of her anatomy I was only able to get the hysteroscope to the distal endocervix and because of the curve was unable to get the hysteroscope to review her endometrial cavity Even with significant traction on the single-tooth tenaculum I was unable to enter the endometrial cavity with the hysteroscope or with a curette Because the Minerva endometrial ablation catheter is much more flexible I did try to do a endometrial ablation using nap hoping that it would make the curve up into the endometrial cavity but that also failed  At that point I went out and talked to the patient's husband and stated that because she had failed megestrol therapy I really did not have anything else to and conservatively and that she would have to have a hysterectomy either now or in the future for definitive management  We discussed it fully and he gave me permission to proceed with abdominal hysterectomy and removal of her fallopian tubes for definitive management  Description of operation: Patient was taken to the operating room placed in supine position where she underwent general endotracheal anesthesia She was placed in dorsolithotomy position and prepped and draped in usual sterile fashion for hysteroscopic procedure Graves speculum was placed in her cervix was grasped with a single-tooth tenaculum Her cervix appeared to  be stenotic and required pediatric dilators to initiate cervical dilation Because of her anatomy and her vaginal length I will was unable to get the hysteroscope into the endometrium so I was unable to visualize endometrium As I stated above I could not safely do the curettage either Because endometrial ablation catheter is somewhat flexible I decided to try to place it in the endometrium hoping that it would make the curve in her anteflexed and anteverted uterus however this was also unsuccessful  As a result I abandoned the hysteroscopy D&C endometrial ablation at this point and when I talked to the husband while preparations were made to convert to a laparotomy procedure both with instruments and prepping  As stated as above I talked to the husband extensively regarding proceeding with hysterectomy and he agreed all questions were answered and we reviewed risk and benefits at that time  I returned to the room the patient been prepped and draped in the usual sterile fashion for Pfannenstiel skin incision Pfannenstiel skin incision was carried down sharply rectus fascia which was scored in the left midline extended laterally The fascia was taken off the muscle superiorly and inferiorly without difficulty and the muscles were divided Peritoneal cavity was entered An Alexis self-retaining retractor was placed The upper abdomen was packed away with moist towels The uterus was grasped with Coker clamps bilaterally The right round ligament was suture ligated and cut The left round ligament was suture ligated and cut Vesicouterine serosal flap was created Utero-ovarian ligament was crossclamped bilaterally and double suture ligated with good hemostasis Dissection was performed in the uterine vessels were clamped cut and transfixion suture ligated Serial pedicles were taken down the cervix each pedicle being clamped cut and suture-ligated bilaterally The dissection  was taken down to the vagina and  the vagina was crossclamped and the specimen removed Vaginal angle sutures were placed in the vagina was closed with interrupted figure-of-eight sutures The fallopian tubes were removed bilaterally without difficulty Pelvis was irrigated vigorously The peritoneum was reapproximated bilaterally Arista was placed for additional pedicle hemostasis The Alexis as well as the packs were removed all counts were correct at this point The muscles and peritoneum were reapproximated loosely The fascia was closed using 0 Vicryl running Subcutaneous tissue was made hemostatic and irrigated The subtenons tissue was reapproximated loosely Skin was closed using 3-0 Vicryl and a Keith needle in a subcuticular fashion Dermabond was placed Exparel was injected Honeycomb dressing and pressure dressing were placed  Patient tolerated procedure well she experienced approximately 250 cc of blood loss and was taken recovery in good stable condition all counts correct x3  She received Ancef and Toradol preoperatively prophylactically  Lazaro ArmsLuther H Grantley Savage, MD

## 2018-07-16 ENCOUNTER — Encounter: Payer: Self-pay | Admitting: Obstetrics & Gynecology

## 2018-07-16 ENCOUNTER — Other Ambulatory Visit: Payer: Self-pay | Admitting: Obstetrics & Gynecology

## 2018-07-16 MED ORDER — PHENAZOPYRIDINE HCL 200 MG PO TABS: 200.0000 mg | ORAL_TABLET | Freq: Three times a day (TID) | ORAL | 0 refills | Status: DC | PRN

## 2018-07-16 MED ORDER — CIPROFLOXACIN HCL 500 MG PO TABS
500.0000 mg | ORAL_TABLET | Freq: Two times a day (BID) | ORAL | 0 refills | Status: DC
Start: 2018-07-16 — End: 2021-02-17

## 2018-07-17 ENCOUNTER — Encounter (INDEPENDENT_AMBULATORY_CARE_PROVIDER_SITE_OTHER): Payer: Self-pay

## 2018-07-17 DIAGNOSIS — Z029 Encounter for administrative examinations, unspecified: Secondary | ICD-10-CM

## 2018-07-19 ENCOUNTER — Encounter: Payer: Self-pay | Admitting: *Deleted

## 2018-07-22 ENCOUNTER — Ambulatory Visit (INDEPENDENT_AMBULATORY_CARE_PROVIDER_SITE_OTHER): Payer: 59 | Admitting: Obstetrics & Gynecology

## 2018-07-22 ENCOUNTER — Encounter: Payer: Self-pay | Admitting: Obstetrics & Gynecology

## 2018-07-22 VITALS — BP 121/79 | Wt 258.0 lb

## 2018-07-22 DIAGNOSIS — Z9889 Other specified postprocedural states: Secondary | ICD-10-CM

## 2018-07-22 DIAGNOSIS — Z90710 Acquired absence of both cervix and uterus: Secondary | ICD-10-CM

## 2018-07-22 MED ORDER — OXYCODONE-ACETAMINOPHEN 5-325 MG PO TABS: 1.0000 | ORAL_TABLET | ORAL | 0 refills | Status: DC | PRN

## 2018-07-22 NOTE — Progress Notes (Signed)
  HPI: Patient returns for routine postoperative follow-up having undergone TAH BS  on 07/10/2018.  The patient's immediate postoperative recovery has been unremarkable. Since hospital discharge the patient reports consitpation, abdominal wall pain   Current Outpatient Medications: Cholecalciferol (VITAMIN D3) 2000 units TABS, Take 2,000 Units by mouth daily., Disp: , Rfl:  ciprofloxacin (CIPRO) 500 MG tablet, Take 1 tablet (500 mg total) by mouth 2 (two) times daily., Disp: 20 tablet, Rfl: 0 ketorolac (TORADOL) 10 MG tablet, Take 1 tablet (10 mg total) by mouth every 8 (eight) hours as needed., Disp: 15 tablet, Rfl: 0 meloxicam (MOBIC) 15 MG tablet, Take 15 mg by mouth daily as needed for pain. , Disp: , Rfl:  oxyCODONE-acetaminophen (PERCOCET) 7.5-325 MG tablet, Take 1-2 tablets by mouth every 4 (four) hours as needed for moderate pain., Disp: 40 tablet, Rfl: 0 promethazine (PHENERGAN) 25 MG tablet, Take 1 tablet (25 mg total) by mouth every 6 (six) hours as needed for nausea., Disp: 30 tablet, Rfl: 1 ALPRAZolam (XANAX) 0.5 MG tablet, Take one tablet before each flight (Patient not taking: Reported on 07/02/2018), Disp: 4 tablet, Rfl: 0 phenazopyridine (PYRIDIUM) 200 MG tablet, Take 1 tablet (200 mg total) by mouth 3 (three) times daily as needed for pain. (Patient not taking: Reported on 07/22/2018), Disp: 21 tablet, Rfl: 0  No current facility-administered medications for this visit.     Blood pressure 121/79, weight 258 lb (117 kg), last menstrual period 05/31/2018.   Physical Exam: Incision clean dry intact Abdomen soft normal benign post op   Diagnostic Tests:   Pathology: benign  Impression: S/p TAH BS  Plan: Meds ordered this encounter  Medications  . oxyCODONE-acetaminophen (PERCOCET/ROXICET) 5-325 MG tablet    Sig: Take 1 tablet by mouth every 4 (four) hours as needed for severe pain.    Dispense:  20 tablet    Refill:  0     Follow up: 2  weeks  Lazaro ArmsLuther H Eure,  MD

## 2018-07-30 ENCOUNTER — Encounter: Payer: Self-pay | Admitting: Obstetrics & Gynecology

## 2018-08-06 ENCOUNTER — Encounter: Payer: Self-pay | Admitting: Obstetrics & Gynecology

## 2018-08-06 ENCOUNTER — Ambulatory Visit (INDEPENDENT_AMBULATORY_CARE_PROVIDER_SITE_OTHER): Payer: 59 | Admitting: Obstetrics & Gynecology

## 2018-08-06 VITALS — BP 118/74 | Ht 65.0 in | Wt 263.0 lb

## 2018-08-06 DIAGNOSIS — Z9889 Other specified postprocedural states: Secondary | ICD-10-CM

## 2018-08-06 DIAGNOSIS — Z90710 Acquired absence of both cervix and uterus: Secondary | ICD-10-CM

## 2018-08-06 MED ORDER — SOLIFENACIN SUCCINATE 10 MG PO TABS
10.0000 mg | ORAL_TABLET | Freq: Every day | ORAL | 11 refills | Status: DC
Start: 2018-08-06 — End: 2021-02-17

## 2018-08-06 NOTE — Progress Notes (Signed)
  HPI: Patient returns for routine postoperative follow-up having undergone TAH on 07/10/2018.  The patient's immediate postoperative recovery has been unremarkable. Since hospital discharge the patient reports left abdominal pain.   Current Outpatient Medications: Cholecalciferol (VITAMIN D3) 2000 units TABS, Take 2,000 Units by mouth daily., Disp: , Rfl:  meloxicam (MOBIC) 15 MG tablet, Take 15 mg by mouth daily as needed for pain. , Disp: , Rfl:  oxyCODONE-acetaminophen (PERCOCET/ROXICET) 5-325 MG tablet, Take 1 tablet by mouth every 4 (four) hours as needed for severe pain., Disp: 20 tablet, Rfl: 0 ALPRAZolam (XANAX) 0.5 MG tablet, Take one tablet before each flight (Patient not taking: Reported on 07/02/2018), Disp: 4 tablet, Rfl: 0 ciprofloxacin (CIPRO) 500 MG tablet, Take 1 tablet (500 mg total) by mouth 2 (two) times daily. (Patient not taking: Reported on 08/06/2018), Disp: 20 tablet, Rfl: 0 ketorolac (TORADOL) 10 MG tablet, Take 1 tablet (10 mg total) by mouth every 8 (eight) hours as needed. (Patient not taking: Reported on 08/06/2018), Disp: 15 tablet, Rfl: 0 oxyCODONE-acetaminophen (PERCOCET) 7.5-325 MG tablet, Take 1-2 tablets by mouth every 4 (four) hours as needed for moderate pain. (Patient not taking: Reported on 08/06/2018), Disp: 40 tablet, Rfl: 0 phenazopyridine (PYRIDIUM) 200 MG tablet, Take 1 tablet (200 mg total) by mouth 3 (three) times daily as needed for pain. (Patient not taking: Reported on 07/22/2018), Disp: 21 tablet, Rfl: 0 promethazine (PHENERGAN) 25 MG tablet, Take 1 tablet (25 mg total) by mouth every 6 (six) hours as needed for nausea. (Patient not taking: Reported on 08/06/2018), Disp: 30 tablet, Rfl: 1  No current facility-administered medications for this visit.     Blood pressure 118/74, height 5\' 5"  (1.651 m), weight 263 lb (119.3 kg), last menstrual period 05/31/2018.  Physical Exam: Incision clean dry intact Area just cephalad to left lateral portion of  inicison is still point tender up the edge of the rectus anterior and transverse Vaginal cuff intact healing still suture present  Diagnostic Tests:   Pathology: benign  Impression: S/p TAH  Plan:   Follow up: 1  years  Lazaro ArmsLuther H Shantana Christon, MD

## 2018-08-19 DIAGNOSIS — Z1329 Encounter for screening for other suspected endocrine disorder: Secondary | ICD-10-CM | POA: Diagnosis not present

## 2018-08-19 DIAGNOSIS — R7301 Impaired fasting glucose: Secondary | ICD-10-CM | POA: Diagnosis not present

## 2018-08-19 DIAGNOSIS — R600 Localized edema: Secondary | ICD-10-CM | POA: Diagnosis not present

## 2018-08-21 DIAGNOSIS — E782 Mixed hyperlipidemia: Secondary | ICD-10-CM | POA: Diagnosis not present

## 2018-08-21 DIAGNOSIS — M25562 Pain in left knee: Secondary | ICD-10-CM | POA: Diagnosis not present

## 2018-08-21 DIAGNOSIS — R600 Localized edema: Secondary | ICD-10-CM | POA: Diagnosis not present

## 2018-08-21 DIAGNOSIS — M25561 Pain in right knee: Secondary | ICD-10-CM | POA: Diagnosis not present

## 2018-08-21 DIAGNOSIS — N939 Abnormal uterine and vaginal bleeding, unspecified: Secondary | ICD-10-CM | POA: Diagnosis not present

## 2018-08-21 DIAGNOSIS — M170 Bilateral primary osteoarthritis of knee: Secondary | ICD-10-CM | POA: Diagnosis not present

## 2018-08-21 DIAGNOSIS — M1711 Unilateral primary osteoarthritis, right knee: Secondary | ICD-10-CM | POA: Diagnosis not present

## 2018-08-21 DIAGNOSIS — R7301 Impaired fasting glucose: Secondary | ICD-10-CM | POA: Diagnosis not present

## 2018-08-21 DIAGNOSIS — Z0001 Encounter for general adult medical examination with abnormal findings: Secondary | ICD-10-CM | POA: Diagnosis not present

## 2018-08-21 DIAGNOSIS — Z6841 Body Mass Index (BMI) 40.0 and over, adult: Secondary | ICD-10-CM | POA: Diagnosis not present

## 2018-09-25 DIAGNOSIS — R600 Localized edema: Secondary | ICD-10-CM | POA: Diagnosis not present

## 2018-09-25 DIAGNOSIS — Z6841 Body Mass Index (BMI) 40.0 and over, adult: Secondary | ICD-10-CM | POA: Diagnosis not present

## 2018-10-09 DIAGNOSIS — M7062 Trochanteric bursitis, left hip: Secondary | ICD-10-CM | POA: Diagnosis not present

## 2018-10-19 DIAGNOSIS — R7301 Impaired fasting glucose: Secondary | ICD-10-CM | POA: Diagnosis not present

## 2018-10-19 DIAGNOSIS — R69 Illness, unspecified: Secondary | ICD-10-CM | POA: Diagnosis not present

## 2018-10-19 DIAGNOSIS — R079 Chest pain, unspecified: Secondary | ICD-10-CM | POA: Diagnosis not present

## 2018-10-19 DIAGNOSIS — Z6841 Body Mass Index (BMI) 40.0 and over, adult: Secondary | ICD-10-CM | POA: Diagnosis not present

## 2018-10-21 ENCOUNTER — Other Ambulatory Visit (HOSPITAL_COMMUNITY): Payer: Self-pay | Admitting: Internal Medicine

## 2018-10-21 DIAGNOSIS — Z1239 Encounter for other screening for malignant neoplasm of breast: Secondary | ICD-10-CM

## 2018-10-31 ENCOUNTER — Ambulatory Visit (HOSPITAL_COMMUNITY)
Admission: RE | Admit: 2018-10-31 | Discharge: 2018-10-31 | Disposition: A | Discharge status: Home / Self Care | Payer: 59 | Source: Ambulatory Visit | Attending: Internal Medicine | Admitting: Internal Medicine

## 2018-10-31 DIAGNOSIS — Z1239 Encounter for other screening for malignant neoplasm of breast: Secondary | ICD-10-CM

## 2018-10-31 DIAGNOSIS — Z1231 Encounter for screening mammogram for malignant neoplasm of breast: Secondary | ICD-10-CM | POA: Diagnosis not present

## 2018-12-24 DIAGNOSIS — R3129 Other microscopic hematuria: Secondary | ICD-10-CM | POA: Diagnosis not present

## 2018-12-24 DIAGNOSIS — M25561 Pain in right knee: Secondary | ICD-10-CM | POA: Diagnosis not present

## 2018-12-24 DIAGNOSIS — R079 Chest pain, unspecified: Secondary | ICD-10-CM | POA: Diagnosis not present

## 2018-12-24 DIAGNOSIS — E782 Mixed hyperlipidemia: Secondary | ICD-10-CM | POA: Diagnosis not present

## 2018-12-24 DIAGNOSIS — N939 Abnormal uterine and vaginal bleeding, unspecified: Secondary | ICD-10-CM | POA: Diagnosis not present

## 2018-12-24 DIAGNOSIS — M7062 Trochanteric bursitis, left hip: Secondary | ICD-10-CM | POA: Diagnosis not present

## 2018-12-24 DIAGNOSIS — R252 Cramp and spasm: Secondary | ICD-10-CM | POA: Diagnosis not present

## 2018-12-24 DIAGNOSIS — R110 Nausea: Secondary | ICD-10-CM | POA: Diagnosis not present

## 2018-12-24 DIAGNOSIS — R109 Unspecified abdominal pain: Secondary | ICD-10-CM | POA: Diagnosis not present

## 2018-12-24 DIAGNOSIS — R102 Pelvic and perineal pain: Secondary | ICD-10-CM | POA: Diagnosis not present

## 2018-12-24 DIAGNOSIS — R300 Dysuria: Secondary | ICD-10-CM | POA: Diagnosis not present

## 2018-12-24 DIAGNOSIS — R69 Illness, unspecified: Secondary | ICD-10-CM | POA: Diagnosis not present

## 2018-12-30 ENCOUNTER — Other Ambulatory Visit: Payer: Self-pay | Admitting: Internal Medicine

## 2018-12-30 ENCOUNTER — Other Ambulatory Visit (HOSPITAL_COMMUNITY): Payer: Self-pay | Admitting: Internal Medicine

## 2018-12-30 DIAGNOSIS — R1012 Left upper quadrant pain: Secondary | ICD-10-CM

## 2019-01-03 ENCOUNTER — Ambulatory Visit (HOSPITAL_COMMUNITY)
Admission: RE | Admit: 2019-01-03 | Discharge: 2019-01-03 | Disposition: A | Discharge status: Home / Self Care | Payer: 59 | Source: Ambulatory Visit | Attending: Internal Medicine | Admitting: Internal Medicine

## 2019-01-03 DIAGNOSIS — R1012 Left upper quadrant pain: Secondary | ICD-10-CM | POA: Diagnosis not present

## 2019-01-03 DIAGNOSIS — R112 Nausea with vomiting, unspecified: Secondary | ICD-10-CM | POA: Diagnosis not present

## 2019-01-03 DIAGNOSIS — R1110 Vomiting, unspecified: Secondary | ICD-10-CM | POA: Diagnosis not present

## 2019-01-03 LAB — POCT I-STAT CREATININE: CREATININE: 0.5 mg/dL (ref 0.44–1.00)

## 2019-01-03 MED ORDER — IOPAMIDOL (ISOVUE-300) INJECTION 61%
100.0000 mL | Freq: Once | INTRAVENOUS | Status: AC | PRN
  Administered 2019-01-03: 100 mL via INTRAVENOUS

## 2019-02-11 DIAGNOSIS — M25511 Pain in right shoulder: Secondary | ICD-10-CM | POA: Diagnosis not present

## 2019-02-11 DIAGNOSIS — H9203 Otalgia, bilateral: Secondary | ICD-10-CM | POA: Diagnosis not present

## 2019-02-25 DIAGNOSIS — E782 Mixed hyperlipidemia: Secondary | ICD-10-CM | POA: Diagnosis not present

## 2019-02-25 DIAGNOSIS — R7301 Impaired fasting glucose: Secondary | ICD-10-CM | POA: Diagnosis not present

## 2019-02-28 DIAGNOSIS — E1169 Type 2 diabetes mellitus with other specified complication: Secondary | ICD-10-CM | POA: Diagnosis not present

## 2019-02-28 DIAGNOSIS — E782 Mixed hyperlipidemia: Secondary | ICD-10-CM | POA: Diagnosis not present

## 2019-02-28 DIAGNOSIS — R600 Localized edema: Secondary | ICD-10-CM | POA: Diagnosis not present

## 2019-02-28 DIAGNOSIS — R69 Illness, unspecified: Secondary | ICD-10-CM | POA: Diagnosis not present

## 2019-03-03 ENCOUNTER — Other Ambulatory Visit (HOSPITAL_COMMUNITY): Payer: Self-pay | Admitting: Adult Health Nurse Practitioner

## 2019-03-03 ENCOUNTER — Ambulatory Visit (HOSPITAL_COMMUNITY)
Admission: RE | Admit: 2019-03-03 | Discharge: 2019-03-03 | Disposition: A | Discharge status: Home / Self Care | Payer: 59 | Source: Ambulatory Visit | Attending: Adult Health Nurse Practitioner | Admitting: Adult Health Nurse Practitioner

## 2019-03-03 DIAGNOSIS — R079 Chest pain, unspecified: Secondary | ICD-10-CM | POA: Diagnosis not present

## 2019-03-03 DIAGNOSIS — R05 Cough: Secondary | ICD-10-CM | POA: Diagnosis not present

## 2019-03-03 DIAGNOSIS — R0602 Shortness of breath: Secondary | ICD-10-CM | POA: Diagnosis not present

## 2019-03-28 DIAGNOSIS — R197 Diarrhea, unspecified: Secondary | ICD-10-CM | POA: Diagnosis not present

## 2019-03-28 DIAGNOSIS — R69 Illness, unspecified: Secondary | ICD-10-CM | POA: Diagnosis not present

## 2019-03-28 DIAGNOSIS — E1169 Type 2 diabetes mellitus with other specified complication: Secondary | ICD-10-CM | POA: Diagnosis not present

## 2019-06-20 DIAGNOSIS — E782 Mixed hyperlipidemia: Secondary | ICD-10-CM | POA: Diagnosis not present

## 2019-06-20 DIAGNOSIS — R7301 Impaired fasting glucose: Secondary | ICD-10-CM | POA: Diagnosis not present

## 2019-06-20 DIAGNOSIS — E1169 Type 2 diabetes mellitus with other specified complication: Secondary | ICD-10-CM | POA: Diagnosis not present

## 2019-06-25 DIAGNOSIS — E782 Mixed hyperlipidemia: Secondary | ICD-10-CM | POA: Diagnosis not present

## 2019-06-25 DIAGNOSIS — E1169 Type 2 diabetes mellitus with other specified complication: Secondary | ICD-10-CM | POA: Diagnosis not present

## 2019-06-25 DIAGNOSIS — Z6841 Body Mass Index (BMI) 40.0 and over, adult: Secondary | ICD-10-CM | POA: Diagnosis not present

## 2019-06-25 DIAGNOSIS — R69 Illness, unspecified: Secondary | ICD-10-CM | POA: Diagnosis not present

## 2019-06-25 DIAGNOSIS — M25561 Pain in right knee: Secondary | ICD-10-CM | POA: Diagnosis not present

## 2019-06-25 DIAGNOSIS — R600 Localized edema: Secondary | ICD-10-CM | POA: Diagnosis not present

## 2019-08-12 ENCOUNTER — Encounter: Payer: Self-pay | Admitting: Orthopaedic Surgery

## 2019-10-02 ENCOUNTER — Other Ambulatory Visit (HOSPITAL_COMMUNITY): Payer: Self-pay | Admitting: Obstetrics & Gynecology

## 2019-10-02 DIAGNOSIS — Z1231 Encounter for screening mammogram for malignant neoplasm of breast: Secondary | ICD-10-CM

## 2019-11-03 ENCOUNTER — Ambulatory Visit (HOSPITAL_COMMUNITY)
Admission: RE | Admit: 2019-11-03 | Discharge: 2019-11-03 | Disposition: A | Discharge status: Home / Self Care | Payer: 59 | Source: Ambulatory Visit | Attending: Obstetrics & Gynecology | Admitting: Obstetrics & Gynecology

## 2019-11-03 ENCOUNTER — Other Ambulatory Visit: Payer: Self-pay

## 2019-11-03 DIAGNOSIS — Z1231 Encounter for screening mammogram for malignant neoplasm of breast: Secondary | ICD-10-CM | POA: Insufficient documentation

## 2019-11-13 DIAGNOSIS — E1169 Type 2 diabetes mellitus with other specified complication: Secondary | ICD-10-CM | POA: Diagnosis not present

## 2019-11-13 DIAGNOSIS — R7301 Impaired fasting glucose: Secondary | ICD-10-CM | POA: Diagnosis not present

## 2019-11-13 DIAGNOSIS — E782 Mixed hyperlipidemia: Secondary | ICD-10-CM | POA: Diagnosis not present

## 2019-11-18 DIAGNOSIS — Z6841 Body Mass Index (BMI) 40.0 and over, adult: Secondary | ICD-10-CM | POA: Diagnosis not present

## 2019-11-18 DIAGNOSIS — R600 Localized edema: Secondary | ICD-10-CM | POA: Diagnosis not present

## 2019-11-18 DIAGNOSIS — E782 Mixed hyperlipidemia: Secondary | ICD-10-CM | POA: Diagnosis not present

## 2019-11-18 DIAGNOSIS — R69 Illness, unspecified: Secondary | ICD-10-CM | POA: Diagnosis not present

## 2019-11-18 DIAGNOSIS — Z800 Family history of malignant neoplasm of digestive organs: Secondary | ICD-10-CM | POA: Diagnosis not present

## 2019-11-18 DIAGNOSIS — E1169 Type 2 diabetes mellitus with other specified complication: Secondary | ICD-10-CM | POA: Diagnosis not present

## 2020-01-06 DIAGNOSIS — M545 Low back pain: Secondary | ICD-10-CM | POA: Diagnosis not present

## 2020-02-10 ENCOUNTER — Telehealth: Payer: Self-pay | Admitting: Gastroenterology

## 2020-02-10 NOTE — Telephone Encounter (Signed)
Latoya Espinoza is out this afternoon, I will check with her tomorrow and see if she has the records. It does not look like they are available in Epic. If not we can request them from Dr. Luvenia Starch office, however sometimes records are destroyed after 10 years.

## 2020-02-10 NOTE — Telephone Encounter (Signed)
Patient was seen at [facility] in 2010 by Dr Benard Rink.  She was not happy with the care she received there and would like to transfer care here.  Pt has a family history of colon cancer on her mom's side x3 (both grandparents dx in their 82's and aunt at age 45).  Records are in Epic and patient has referral for colonoscopy, please advise.

## 2020-02-10 NOTE — Telephone Encounter (Signed)
Patient may be scheduled with me. Please try to obtain a paper copy of the results available in EPIC and CareEverywhere so that I can review them. Thank you.

## 2020-02-10 NOTE — Telephone Encounter (Signed)
I see the records noted in EPIC but find there are no documents available to review. Any suggestions on how to view the documents? Thank you.

## 2020-02-17 DIAGNOSIS — R1012 Left upper quadrant pain: Secondary | ICD-10-CM | POA: Diagnosis not present

## 2020-02-17 DIAGNOSIS — M545 Low back pain: Secondary | ICD-10-CM | POA: Diagnosis not present

## 2020-02-17 DIAGNOSIS — R1032 Left lower quadrant pain: Secondary | ICD-10-CM | POA: Diagnosis not present

## 2020-02-17 DIAGNOSIS — R079 Chest pain, unspecified: Secondary | ICD-10-CM | POA: Diagnosis not present

## 2020-03-08 DIAGNOSIS — R1084 Generalized abdominal pain: Secondary | ICD-10-CM | POA: Diagnosis not present

## 2020-03-08 DIAGNOSIS — R140 Abdominal distension (gaseous): Secondary | ICD-10-CM | POA: Diagnosis not present

## 2020-03-08 DIAGNOSIS — R194 Change in bowel habit: Secondary | ICD-10-CM | POA: Diagnosis not present

## 2020-03-08 DIAGNOSIS — Z86010 Personal history of colonic polyps: Secondary | ICD-10-CM | POA: Diagnosis not present

## 2020-03-08 DIAGNOSIS — Z800 Family history of malignant neoplasm of digestive organs: Secondary | ICD-10-CM | POA: Diagnosis not present

## 2020-03-08 DIAGNOSIS — R634 Abnormal weight loss: Secondary | ICD-10-CM | POA: Diagnosis not present

## 2020-04-06 DIAGNOSIS — Z1159 Encounter for screening for other viral diseases: Secondary | ICD-10-CM | POA: Diagnosis not present

## 2020-04-09 DIAGNOSIS — R194 Change in bowel habit: Secondary | ICD-10-CM | POA: Diagnosis not present

## 2020-04-09 DIAGNOSIS — R1010 Upper abdominal pain, unspecified: Secondary | ICD-10-CM | POA: Diagnosis not present

## 2020-04-09 DIAGNOSIS — K2930 Chronic superficial gastritis without bleeding: Secondary | ICD-10-CM | POA: Diagnosis not present

## 2020-04-09 DIAGNOSIS — R140 Abdominal distension (gaseous): Secondary | ICD-10-CM | POA: Diagnosis not present

## 2020-04-09 DIAGNOSIS — R1084 Generalized abdominal pain: Secondary | ICD-10-CM | POA: Diagnosis not present

## 2020-04-09 DIAGNOSIS — R634 Abnormal weight loss: Secondary | ICD-10-CM | POA: Diagnosis not present

## 2020-04-09 DIAGNOSIS — R197 Diarrhea, unspecified: Secondary | ICD-10-CM | POA: Diagnosis not present

## 2020-04-09 DIAGNOSIS — K635 Polyp of colon: Secondary | ICD-10-CM | POA: Diagnosis not present

## 2020-04-23 DIAGNOSIS — R1084 Generalized abdominal pain: Secondary | ICD-10-CM | POA: Diagnosis not present

## 2020-04-23 DIAGNOSIS — K635 Polyp of colon: Secondary | ICD-10-CM | POA: Diagnosis not present

## 2020-04-23 DIAGNOSIS — K529 Noninfective gastroenteritis and colitis, unspecified: Secondary | ICD-10-CM | POA: Diagnosis not present

## 2020-04-23 DIAGNOSIS — K2930 Chronic superficial gastritis without bleeding: Secondary | ICD-10-CM | POA: Diagnosis not present

## 2020-05-03 DIAGNOSIS — R69 Illness, unspecified: Secondary | ICD-10-CM | POA: Diagnosis not present

## 2020-05-03 DIAGNOSIS — R079 Chest pain, unspecified: Secondary | ICD-10-CM | POA: Diagnosis not present

## 2020-05-03 DIAGNOSIS — N939 Abnormal uterine and vaginal bleeding, unspecified: Secondary | ICD-10-CM | POA: Diagnosis not present

## 2020-05-03 DIAGNOSIS — M7062 Trochanteric bursitis, left hip: Secondary | ICD-10-CM | POA: Diagnosis not present

## 2020-05-03 DIAGNOSIS — E1169 Type 2 diabetes mellitus with other specified complication: Secondary | ICD-10-CM | POA: Diagnosis not present

## 2020-05-03 DIAGNOSIS — M25561 Pain in right knee: Secondary | ICD-10-CM | POA: Diagnosis not present

## 2020-05-03 DIAGNOSIS — E782 Mixed hyperlipidemia: Secondary | ICD-10-CM | POA: Diagnosis not present

## 2020-05-03 DIAGNOSIS — M545 Low back pain: Secondary | ICD-10-CM | POA: Diagnosis not present

## 2020-05-05 DIAGNOSIS — E1169 Type 2 diabetes mellitus with other specified complication: Secondary | ICD-10-CM | POA: Diagnosis not present

## 2020-05-05 DIAGNOSIS — R69 Illness, unspecified: Secondary | ICD-10-CM | POA: Diagnosis not present

## 2020-05-05 DIAGNOSIS — Z6841 Body Mass Index (BMI) 40.0 and over, adult: Secondary | ICD-10-CM | POA: Diagnosis not present

## 2020-05-05 DIAGNOSIS — K635 Polyp of colon: Secondary | ICD-10-CM | POA: Diagnosis not present

## 2020-05-05 DIAGNOSIS — Z800 Family history of malignant neoplasm of digestive organs: Secondary | ICD-10-CM | POA: Diagnosis not present

## 2020-05-05 DIAGNOSIS — L2489 Irritant contact dermatitis due to other agents: Secondary | ICD-10-CM | POA: Diagnosis not present

## 2020-05-05 DIAGNOSIS — E782 Mixed hyperlipidemia: Secondary | ICD-10-CM | POA: Diagnosis not present

## 2020-05-05 DIAGNOSIS — R600 Localized edema: Secondary | ICD-10-CM | POA: Diagnosis not present

## 2020-07-02 DIAGNOSIS — J069 Acute upper respiratory infection, unspecified: Secondary | ICD-10-CM | POA: Diagnosis not present

## 2020-07-03 ENCOUNTER — Encounter (HOSPITAL_COMMUNITY): Payer: Self-pay

## 2020-07-03 ENCOUNTER — Emergency Department (HOSPITAL_COMMUNITY)
Admission: EM | Admit: 2020-07-03 | Discharge: 2020-07-03 | Disposition: A | Discharge status: Home / Self Care | Payer: 59 | Attending: Emergency Medicine | Admitting: Emergency Medicine

## 2020-07-03 ENCOUNTER — Ambulatory Visit: Payer: Self-pay

## 2020-07-03 ENCOUNTER — Other Ambulatory Visit: Payer: Self-pay

## 2020-07-03 DIAGNOSIS — E1165 Type 2 diabetes mellitus with hyperglycemia: Secondary | ICD-10-CM | POA: Diagnosis not present

## 2020-07-03 DIAGNOSIS — R631 Polydipsia: Secondary | ICD-10-CM | POA: Insufficient documentation

## 2020-07-03 DIAGNOSIS — R358 Other polyuria: Secondary | ICD-10-CM | POA: Insufficient documentation

## 2020-07-03 DIAGNOSIS — R739 Hyperglycemia, unspecified: Secondary | ICD-10-CM | POA: Diagnosis not present

## 2020-07-03 DIAGNOSIS — R5381 Other malaise: Secondary | ICD-10-CM | POA: Insufficient documentation

## 2020-07-03 LAB — BASIC METABOLIC PANEL
Anion gap: 9 (ref 5–15)
BUN: 13 mg/dL (ref 6–20)
CO2: 23 mmol/L (ref 22–32)
Calcium: 8.9 mg/dL (ref 8.9–10.3)
Chloride: 104 mmol/L (ref 98–111)
Creatinine, Ser: 0.57 mg/dL (ref 0.44–1.00)
GFR calc Af Amer: >60 mL/min (ref >60)
GFR calc non Af Amer: >60 mL/min (ref >60)
Glucose, Bld: 265 mg/dL — ABNORMAL HIGH (ref 70–99)
Potassium: 4.1 mmol/L (ref 3.5–5.1)
Sodium: 136 mmol/L (ref 135–145)

## 2020-07-03 LAB — URINALYSIS, ROUTINE W REFLEX MICROSCOPIC
Bacteria, UA: NONE SEEN
Bilirubin Urine: NEGATIVE
Glucose, UA: >=500 mg/dL — AB
Hgb urine dipstick: NEGATIVE
Ketones, ur: NEGATIVE mg/dL
Leukocytes,Ua: NEGATIVE
Nitrite: NEGATIVE
Protein, ur: NEGATIVE mg/dL
Specific Gravity, Urine: 1.026 (ref 1.005–1.030)
pH: 7 (ref 5.0–8.0)

## 2020-07-03 LAB — CBC
HCT: 40.3 % (ref 36.0–46.0)
Hemoglobin: 13.3 g/dL (ref 12.0–15.0)
MCH: 30.2 pg (ref 26.0–34.0)
MCHC: 33 g/dL (ref 30.0–36.0)
MCV: 91.4 fL (ref 80.0–100.0)
Platelets: 258 10*3/uL (ref 150–400)
RBC: 4.41 MIL/uL (ref 3.87–5.11)
RDW: 12.6 % (ref 11.5–15.5)
WBC: 8.3 10*3/uL (ref 4.0–10.5)
nRBC: 0 % (ref 0.0–0.2)

## 2020-07-03 LAB — CBG MONITORING, ED: Glucose-Capillary: 303 mg/dL — ABNORMAL HIGH (ref 70–99)

## 2020-07-03 MED ORDER — SODIUM CHLORIDE 0.9 % IV BOLUS
1000.0000 mL | Freq: Once | INTRAVENOUS | Status: AC
  Administered 2020-07-03: 1000 mL via INTRAVENOUS

## 2020-07-03 NOTE — ED Triage Notes (Signed)
Pt reports taking blood sugar this am and was 437. Pt is on Prednisone for Bronchitis x2 days.    Pt c/o increased urination, and increased perspiration. Also states she feels shaky.  Pt states she chugged water PTA. CBG 303 in triage.

## 2020-07-03 NOTE — Discharge Instructions (Addendum)
You were evaluated in the Emergency Department and after careful evaluation, we did not find any emergent condition requiring admission or further testing in the hospital.  Your exam/testing today was overall reassuring.  As discussed, we recommend that you stop taking the prednisone medication and continue the azithromycin and cough medicine and continue to keep a close eye on your sugars at home.  Please return to the Emergency Department if you experience any worsening of your condition.  We encourage you to follow up with a primary care provider.  Thank you for allowing [place] to be a part of your care.

## 2020-07-03 NOTE — ED Provider Notes (Signed)
[facility] Emergency Department Provider Note MRN:  [id number]  Arrival date & time: 07/03/20     Chief Complaint   Hyperglycemia   History of Present Illness   Latoya Espinoza is a 44 y.o. year-old female with a history of diabetes presenting to the ED with chief complaint of hyperglycemia.  Patient has felt shaky, general malaise, low energy, also with polyuria and polydipsia today.  Explains that she was recently diagnosed with bronchitis and started on a Z-Pak as well as prednisone.  Checked her sugar today and it was the highest she has ever seen it, in the 400s.  Friend told her to get checked out.  She denies any fever, cough is improving, no burning with urination, no chest pain or shortness of breath, no abdominal pain.  Review of Systems  A complete 10 system review of systems was obtained and all systems are negative except as noted in the HPI and PMH.   Patient's Health History    Past Medical History:  Diagnosis Date  . Arthritis   . GERD (gastroesophageal reflux disease)   . Obesity     Past Surgical History:  Procedure Laterality Date  . CHOLECYSTECTOMY    . DILITATION & CURRETTAGE/HYSTROSCOPY WITH NOVASURE ABLATION N/A 07/10/2018   Procedure: FAILED HYSTEROSCOPY, DILATION AND CURETTAGE AND ENDOMETRIAL ABLATION;  Surgeon: Lazaro Arms, MD;  Location: [facility];  Service: Gynecology;  Laterality: N/A;  . HYSTERECTOMY ABDOMINAL WITH SALPINGECTOMY Bilateral 07/10/2018   Procedure: HYSTERECTOMY ABDOMINAL WITH BILATERAL SALPINGECTOMY;  Surgeon: Lazaro Arms, MD;  Location: [facility];  Service: Gynecology;  Laterality: Bilateral;    Family History  Problem Relation Age of Onset  . Cancer Mother        stage 4 lung cancer    Social History   Socioeconomic History  . Marital status: Married    Spouse name: Not on file  . Number of children: Not on file  . Years of education: Not on file  . Highest education level: Not on file    Occupational History  . Not on file  Tobacco Use  . Smoking status: Never Smoker  . Smokeless tobacco: Never Used  Vaping Use  . Vaping Use: Never used  Substance and Sexual Activity  . Alcohol use: No  . Drug use: No  . Sexual activity: Not Currently    Birth control/protection: None  Other Topics Concern  . Not on file  Social History Narrative  . Not on file   Social Determinants of Health   Financial Resource Strain:   . Difficulty of Paying Living Expenses:   Food Insecurity:   . Worried About Programme researcher, broadcasting/film/video in the Last Year:   . Barista in the Last Year:   Transportation Needs:   . Freight forwarder (Medical):   Marland Kitchen Lack of Transportation (Non-Medical):   Physical Activity:   . Days of Exercise per Week:   . Minutes of Exercise per Session:   Stress:   . Feeling of Stress :   Social Connections:   . Frequency of Communication with Friends and Family:   . Frequency of Social Gatherings with Friends and Family:   . Attends Religious Services:   . Active Member of Clubs or Organizations:   . Attends Banker Meetings:   Marland Kitchen Marital Status:   Intimate Partner Violence:   . Fear of Current or Ex-Partner:   . Emotionally Abused:   Marland Kitchen Physically Abused:   .  Sexually Abused:      Physical Exam   Vitals:   07/03/20 1548  BP: (!) 151/96  Pulse: 80  Resp: 16  Temp: 98.4 F (36.9 C)  SpO2: 96%    CONSTITUTIONAL: Well-appearing, NAD NEURO:  Alert and oriented x 3, no focal deficits EYES:  eyes equal and reactive ENT/NECK:  no LAD, no JVD CARDIO: Regular rate, well-perfused, normal S1 and S2 PULM:  CTAB no wheezing or rhonchi GI/GU:  normal bowel sounds, non-distended, non-tender MSK/SPINE:  No gross deformities, no edema SKIN:  no rash, atraumatic PSYCH:  Appropriate speech and behavior  *Additional and/or pertinent findings included in MDM below  Diagnostic and Interventional Summary    EKG Interpretation  Date/Time:     Ventricular Rate:    PR Interval:    QRS Duration:   QT Interval:    QTC Calculation:   R Axis:     Text Interpretation:        Labs Reviewed  BASIC METABOLIC PANEL - Abnormal; Notable for the following components:      Result Value   Glucose, Bld 265 (*)    All other components within normal limits  URINALYSIS, ROUTINE W REFLEX MICROSCOPIC - Abnormal; Notable for the following components:   Glucose, UA >=500 (*)    All other components within normal limits  CBG MONITORING, ED - Abnormal; Notable for the following components:   Glucose-Capillary 303 (*)    All other components within normal limits  CBC    No orders to display    Medications  sodium chloride 0.9 % bolus 1,000 mL (1,000 mLs Intravenous New Bag/Given 07/03/20 1724)     Procedures  /  Critical Care Procedures  ED Course and Medical Decision Making  I have reviewed the triage vital signs, the nursing notes, and pertinent available records from the EMR.  Listed above are laboratory and imaging tests that I personally ordered, reviewed, and interpreted and then considered in my medical decision making (see below for details).      Presentation consistent with hyperglycemia, likely attributable to prednisone burst recently.  Will obtain labs to exclude DKA, providing IV fluids.  Afebrile, doubt other infectious cause of the hyperglycemia.  Bronchitis seems to be improving, lungs clear, no indication for imaging.  Labs reassuring, appropriate for discharge.  Elmer Sow. Pilar Plate, MD [facility] Emergency Medicine [facility] [email or web address]  Final Clinical Impressions(s) / ED Diagnoses     ICD-10-CM   1. Hyperglycemia  R73.9     ED Discharge Orders    None       Discharge Instructions Discussed with and Provided to Patient:     Discharge Instructions     You were evaluated in the Emergency Department and after careful evaluation, we did not find any emergent condition requiring  admission or further testing in the hospital.  Your exam/testing today was overall reassuring.  As discussed, we recommend that you stop taking the prednisone medication and continue the azithromycin and cough medicine and continue to keep a close eye on your sugars at home.  Please return to the Emergency Department if you experience any worsening of your condition.  We encourage you to follow up with a primary care provider.  Thank you for allowing [place] to be a part of your care.        Sabas Sous, MD 07/03/20 Harrietta Guardian

## 2020-09-21 DIAGNOSIS — F411 Generalized anxiety disorder: Secondary | ICD-10-CM | POA: Diagnosis not present

## 2020-09-21 DIAGNOSIS — R69 Illness, unspecified: Secondary | ICD-10-CM | POA: Diagnosis not present

## 2020-09-23 ENCOUNTER — Other Ambulatory Visit (HOSPITAL_COMMUNITY): Payer: Self-pay | Admitting: Adult Health Nurse Practitioner

## 2020-09-23 DIAGNOSIS — Z1231 Encounter for screening mammogram for malignant neoplasm of breast: Secondary | ICD-10-CM

## 2020-10-18 DIAGNOSIS — R69 Illness, unspecified: Secondary | ICD-10-CM | POA: Diagnosis not present

## 2020-10-18 DIAGNOSIS — F411 Generalized anxiety disorder: Secondary | ICD-10-CM | POA: Diagnosis not present

## 2020-11-08 ENCOUNTER — Other Ambulatory Visit: Payer: Self-pay

## 2020-11-08 ENCOUNTER — Ambulatory Visit (HOSPITAL_COMMUNITY)
Admission: RE | Admit: 2020-11-08 | Discharge: 2020-11-08 | Disposition: A | Discharge status: Home / Self Care | Payer: 59 | Source: Ambulatory Visit | Attending: Adult Health Nurse Practitioner | Admitting: Adult Health Nurse Practitioner

## 2020-11-08 DIAGNOSIS — Z1231 Encounter for screening mammogram for malignant neoplasm of breast: Secondary | ICD-10-CM | POA: Diagnosis not present

## 2020-11-12 DIAGNOSIS — L2489 Irritant contact dermatitis due to other agents: Secondary | ICD-10-CM | POA: Diagnosis not present

## 2020-11-12 DIAGNOSIS — R109 Unspecified abdominal pain: Secondary | ICD-10-CM | POA: Diagnosis not present

## 2020-11-12 DIAGNOSIS — Z0001 Encounter for general adult medical examination with abnormal findings: Secondary | ICD-10-CM | POA: Diagnosis not present

## 2020-11-12 DIAGNOSIS — R300 Dysuria: Secondary | ICD-10-CM | POA: Diagnosis not present

## 2020-11-12 DIAGNOSIS — M25561 Pain in right knee: Secondary | ICD-10-CM | POA: Diagnosis not present

## 2020-11-12 DIAGNOSIS — R079 Chest pain, unspecified: Secondary | ICD-10-CM | POA: Diagnosis not present

## 2020-11-12 DIAGNOSIS — R69 Illness, unspecified: Secondary | ICD-10-CM | POA: Diagnosis not present

## 2020-11-12 DIAGNOSIS — K635 Polyp of colon: Secondary | ICD-10-CM | POA: Diagnosis not present

## 2020-11-12 DIAGNOSIS — R110 Nausea: Secondary | ICD-10-CM | POA: Diagnosis not present

## 2020-11-12 DIAGNOSIS — E782 Mixed hyperlipidemia: Secondary | ICD-10-CM | POA: Diagnosis not present

## 2020-11-17 DIAGNOSIS — K635 Polyp of colon: Secondary | ICD-10-CM | POA: Diagnosis not present

## 2020-11-17 DIAGNOSIS — R600 Localized edema: Secondary | ICD-10-CM | POA: Diagnosis not present

## 2020-11-17 DIAGNOSIS — Z0001 Encounter for general adult medical examination with abnormal findings: Secondary | ICD-10-CM | POA: Diagnosis not present

## 2020-11-17 DIAGNOSIS — R69 Illness, unspecified: Secondary | ICD-10-CM | POA: Diagnosis not present

## 2020-11-17 DIAGNOSIS — Z800 Family history of malignant neoplasm of digestive organs: Secondary | ICD-10-CM | POA: Diagnosis not present

## 2020-11-17 DIAGNOSIS — E559 Vitamin D deficiency, unspecified: Secondary | ICD-10-CM | POA: Diagnosis not present

## 2020-11-17 DIAGNOSIS — L2489 Irritant contact dermatitis due to other agents: Secondary | ICD-10-CM | POA: Diagnosis not present

## 2020-11-17 DIAGNOSIS — E1169 Type 2 diabetes mellitus with other specified complication: Secondary | ICD-10-CM | POA: Diagnosis not present

## 2020-11-17 DIAGNOSIS — E782 Mixed hyperlipidemia: Secondary | ICD-10-CM | POA: Diagnosis not present

## 2020-12-23 DIAGNOSIS — K635 Polyp of colon: Secondary | ICD-10-CM | POA: Diagnosis not present

## 2020-12-23 DIAGNOSIS — Z0001 Encounter for general adult medical examination with abnormal findings: Secondary | ICD-10-CM | POA: Diagnosis not present

## 2020-12-23 DIAGNOSIS — Z20822 Contact with and (suspected) exposure to covid-19: Secondary | ICD-10-CM | POA: Diagnosis not present

## 2020-12-23 DIAGNOSIS — E559 Vitamin D deficiency, unspecified: Secondary | ICD-10-CM | POA: Diagnosis not present

## 2020-12-23 DIAGNOSIS — Z6841 Body Mass Index (BMI) 40.0 and over, adult: Secondary | ICD-10-CM | POA: Diagnosis not present

## 2020-12-23 DIAGNOSIS — R69 Illness, unspecified: Secondary | ICD-10-CM | POA: Diagnosis not present

## 2020-12-23 DIAGNOSIS — R079 Chest pain, unspecified: Secondary | ICD-10-CM | POA: Diagnosis not present

## 2020-12-23 DIAGNOSIS — R300 Dysuria: Secondary | ICD-10-CM | POA: Diagnosis not present

## 2020-12-23 DIAGNOSIS — R110 Nausea: Secondary | ICD-10-CM | POA: Diagnosis not present

## 2020-12-23 DIAGNOSIS — R109 Unspecified abdominal pain: Secondary | ICD-10-CM | POA: Diagnosis not present

## 2020-12-23 DIAGNOSIS — E782 Mixed hyperlipidemia: Secondary | ICD-10-CM | POA: Diagnosis not present

## 2020-12-30 DIAGNOSIS — Z1152 Encounter for screening for COVID-19: Secondary | ICD-10-CM | POA: Diagnosis not present

## 2020-12-30 DIAGNOSIS — R109 Unspecified abdominal pain: Secondary | ICD-10-CM | POA: Diagnosis not present

## 2020-12-30 DIAGNOSIS — R0981 Nasal congestion: Secondary | ICD-10-CM | POA: Diagnosis not present

## 2020-12-30 DIAGNOSIS — R300 Dysuria: Secondary | ICD-10-CM | POA: Diagnosis not present

## 2020-12-30 DIAGNOSIS — K635 Polyp of colon: Secondary | ICD-10-CM | POA: Diagnosis not present

## 2020-12-30 DIAGNOSIS — E559 Vitamin D deficiency, unspecified: Secondary | ICD-10-CM | POA: Diagnosis not present

## 2020-12-30 DIAGNOSIS — R69 Illness, unspecified: Secondary | ICD-10-CM | POA: Diagnosis not present

## 2020-12-30 DIAGNOSIS — Z20822 Contact with and (suspected) exposure to covid-19: Secondary | ICD-10-CM | POA: Diagnosis not present

## 2020-12-30 DIAGNOSIS — R110 Nausea: Secondary | ICD-10-CM | POA: Diagnosis not present

## 2020-12-30 DIAGNOSIS — R079 Chest pain, unspecified: Secondary | ICD-10-CM | POA: Diagnosis not present

## 2020-12-30 DIAGNOSIS — R5383 Other fatigue: Secondary | ICD-10-CM | POA: Diagnosis not present

## 2020-12-30 DIAGNOSIS — Z6841 Body Mass Index (BMI) 40.0 and over, adult: Secondary | ICD-10-CM | POA: Diagnosis not present

## 2020-12-30 DIAGNOSIS — Z0001 Encounter for general adult medical examination with abnormal findings: Secondary | ICD-10-CM | POA: Diagnosis not present

## 2021-02-09 DIAGNOSIS — E119 Type 2 diabetes mellitus without complications: Secondary | ICD-10-CM

## 2021-02-09 DIAGNOSIS — M1990 Unspecified osteoarthritis, unspecified site: Secondary | ICD-10-CM | POA: Insufficient documentation

## 2021-02-09 HISTORY — DX: Type 2 diabetes mellitus without complications: E11.9

## 2021-02-17 ENCOUNTER — Ambulatory Visit (INDEPENDENT_AMBULATORY_CARE_PROVIDER_SITE_OTHER): Payer: No Typology Code available for payment source

## 2021-02-17 ENCOUNTER — Ambulatory Visit: Payer: No Typology Code available for payment source | Admitting: Podiatry

## 2021-02-17 ENCOUNTER — Other Ambulatory Visit: Payer: Self-pay

## 2021-02-17 ENCOUNTER — Encounter: Payer: Self-pay | Admitting: Podiatry

## 2021-02-17 DIAGNOSIS — M722 Plantar fascial fibromatosis: Secondary | ICD-10-CM

## 2021-02-17 MED ORDER — MELOXICAM 15 MG PO TABS: 15.0000 mg | ORAL_TABLET | Freq: Every day | ORAL | 3 refills | Status: AC

## 2021-02-17 MED ORDER — TRIAMCINOLONE ACETONIDE 40 MG/ML IJ SUSP
20.0000 mg | Freq: Once | INTRAMUSCULAR | Status: AC
  Administered 2021-02-17: 20 mg

## 2021-02-17 NOTE — Patient Instructions (Signed)

## 2021-02-17 NOTE — Progress Notes (Signed)
Subjective:  Patient ID: Latoya Espinoza, female    DOB: [birth year],  MRN: [id number] HPI Chief Complaint  Patient presents with  . Foot Pain    Plantar/posterior heel left - aching x couple months, AM pain, tried Ibuprofen 800mg , tennis ball and ice water bottle-no help  . New Patient (Initial Visit)    45 y.o. female presents with the above complaint.   ROS: Denies fever chills nausea vomiting muscle aches pains calf pain back pain chest pain shortness of breath.  Past Medical History:  Diagnosis Date  . Arthritis   . GERD (gastroesophageal reflux disease)   . Obesity   . Type 2 diabetes mellitus (HCC) 02/09/2021   Past Surgical History:  Procedure Laterality Date  . CHOLECYSTECTOMY    . DILITATION & CURRETTAGE/HYSTROSCOPY WITH NOVASURE ABLATION N/A 07/10/2018   Procedure: FAILED HYSTEROSCOPY, DILATION AND CURETTAGE AND ENDOMETRIAL ABLATION;  Surgeon: 07/12/2018, MD;  Location: [facility];  Service: Gynecology;  Laterality: N/A;  . HYSTERECTOMY ABDOMINAL WITH SALPINGECTOMY Bilateral 07/10/2018   Procedure: HYSTERECTOMY ABDOMINAL WITH BILATERAL SALPINGECTOMY;  Surgeon: 07/12/2018, MD;  Location: [facility];  Service: Gynecology;  Laterality: Bilateral;    Current Outpatient Medications:  .  meloxicam (MOBIC) 15 MG tablet, Take 1 tablet (15 mg total) by mouth daily., Disp: 30 tablet, Rfl: 3 .  Cholecalciferol (VITAMIN D3) 2000 units TABS, Take 2,000 Units by mouth daily., Disp: , Rfl:  .  escitalopram (LEXAPRO) 5 MG tablet, Take by mouth., Disp: , Rfl:  .  metFORMIN (GLUCOPHAGE) 500 MG tablet, Take 500 mg by mouth daily., Disp: , Rfl:  .  tiZANidine (ZANAFLEX) 4 MG tablet, Take 4 mg by mouth 4 (four) times daily as needed., Disp: , Rfl:  .  Vitamin D, Ergocalciferol, (DRISDOL) 1.25 MG (50000 UNIT) CAPS capsule, Take 50,000 Units by mouth once a week., Disp: , Rfl:   Allergies  Allergen Reactions  . Dilaudid [Hydromorphone Hcl] Hives    Per Lazaro Arms, patient states can  tolerate dilaudid but can get some itching. Takes benadryl if needed for that   Review of Systems Objective:  There were no vitals filed for this visit.  General: Well developed, nourished, in no acute distress, alert and oriented x3   Dermatological: Skin is warm, dry and supple bilateral. Nails x 10 are well maintained; remaining integument appears unremarkable at this time. There are no open sores, no preulcerative lesions, no rash or signs of infection present.  Vascular: Dorsalis Pedis artery and Posterior Tibial artery pedal pulses are 2/4 bilateral with immedate capillary fill time. Pedal hair growth present. No varicosities and no lower extremity edema present bilateral.   Neruologic: Grossly intact via light touch bilateral. Vibratory intact via tuning fork bilateral. Protective threshold with Semmes Wienstein monofilament intact to all pedal sites bilateral. Patellar and Achilles deep tendon reflexes 2+ bilateral. No Babinski or clonus noted bilateral.   Musculoskeletal: No gross boney pedal deformities bilateral. No pain, crepitus, or limitation noted with foot and ankle range of motion bilateral. Muscular strength 5/5 in all groups tested bilateral.  Pain on palpation medial calcaneal tubercle of the left heel.  She also has tenderness on palpation of the Achilles at its insertion site with some fluctuance.  No pain on palpation of the posterior tibial tendon.  Gait: Unassisted, Nonantalgic.    Radiographs:  Radiographs taken today demonstrate plantar and posterior calcaneal heel spur soft tissue increase in density plantar fascial kidney insertion site and at the insertion site of  the Achilles.  No significant acute abnormalities are noted.  Assessment & Plan:   Assessment: Achilles tendinitis insertional most likely compensatory due to plantar fasciitis medial band left heel.  Plan: Discussed etiology pathology conservative surgical therapies at this point went ahead and  performed an injection 20 mg Kenalog 5 mg Marcaine point of maximal tenderness of the left heel.  Also injected 2 mg of dexamethasone into the bursa at the posterior aspect of the heel.  I started her on meloxicam and placed on plantar fascial brace and a night splint.  Discussed appropriate shoe gear stretching exercise ice therapy and shoe gear modifications I would like to follow-up with her in 1 month.     Lucion Dilger T. [place], [place]

## 2021-03-24 ENCOUNTER — Ambulatory Visit: Payer: No Typology Code available for payment source | Admitting: Podiatry

## 2021-10-06 ENCOUNTER — Other Ambulatory Visit (HOSPITAL_COMMUNITY): Payer: Self-pay | Admitting: Adult Health Nurse Practitioner

## 2021-10-06 DIAGNOSIS — Z1231 Encounter for screening mammogram for malignant neoplasm of breast: Secondary | ICD-10-CM

## 2021-11-11 ENCOUNTER — Other Ambulatory Visit: Payer: Self-pay

## 2021-11-11 ENCOUNTER — Ambulatory Visit (HOSPITAL_COMMUNITY)
Admission: RE | Admit: 2021-11-11 | Discharge: 2021-11-11 | Disposition: A | Discharge status: Home / Self Care | Payer: 59 | Source: Ambulatory Visit | Attending: Adult Health Nurse Practitioner | Admitting: Adult Health Nurse Practitioner

## 2021-11-11 DIAGNOSIS — Z1231 Encounter for screening mammogram for malignant neoplasm of breast: Secondary | ICD-10-CM | POA: Diagnosis not present

## 2022-02-11 ENCOUNTER — Emergency Department (HOSPITAL_COMMUNITY): Payer: 59

## 2022-02-11 ENCOUNTER — Emergency Department (HOSPITAL_COMMUNITY)
Admission: EM | Admit: 2022-02-11 | Discharge: 2022-02-11 | Disposition: A | Discharge status: Home / Self Care | Payer: 59 | Attending: Emergency Medicine | Admitting: Emergency Medicine

## 2022-02-11 ENCOUNTER — Other Ambulatory Visit: Payer: Self-pay

## 2022-02-11 ENCOUNTER — Encounter (HOSPITAL_COMMUNITY): Payer: Self-pay | Admitting: *Deleted

## 2022-02-11 DIAGNOSIS — S6991XA Unspecified injury of right wrist, hand and finger(s), initial encounter: Secondary | ICD-10-CM | POA: Diagnosis present

## 2022-02-11 DIAGNOSIS — Y92002 Bathroom of unspecified non-institutional (private) residence single-family (private) house as the place of occurrence of the external cause: Secondary | ICD-10-CM | POA: Diagnosis not present

## 2022-02-11 DIAGNOSIS — E119 Type 2 diabetes mellitus without complications: Secondary | ICD-10-CM | POA: Insufficient documentation

## 2022-02-11 DIAGNOSIS — S61224A Laceration with foreign body of right ring finger without damage to nail, initial encounter: Secondary | ICD-10-CM

## 2022-02-11 DIAGNOSIS — W228XXA Striking against or struck by other objects, initial encounter: Secondary | ICD-10-CM | POA: Diagnosis not present

## 2022-02-11 DIAGNOSIS — Z23 Encounter for immunization: Secondary | ICD-10-CM | POA: Diagnosis not present

## 2022-02-11 DIAGNOSIS — Z7984 Long term (current) use of oral hypoglycemic drugs: Secondary | ICD-10-CM | POA: Diagnosis not present

## 2022-02-11 MED ORDER — TETANUS-DIPHTH-ACELL PERTUSSIS 5-2.5-18.5 LF-MCG/0.5 IM SUSY
0.5000 mL | PREFILLED_SYRINGE | Freq: Once | INTRAMUSCULAR | Status: AC
  Administered 2022-02-11: 0.5 mL via INTRAMUSCULAR
  Filled 2022-02-11: qty 0.5

## 2022-02-11 MED ORDER — OXYCODONE-ACETAMINOPHEN 5-325 MG PO TABS: 1.0000 | ORAL_TABLET | Freq: Three times a day (TID) | ORAL | 0 refills | Status: AC | PRN

## 2022-02-11 MED ORDER — HYDROCODONE-ACETAMINOPHEN 5-325 MG PO TABS
1.0000 | ORAL_TABLET | Freq: Once | ORAL | Status: AC
  Administered 2022-02-11: 1 via ORAL
  Filled 2022-02-11: qty 1

## 2022-02-11 NOTE — ED Provider Notes (Signed)
[facility] Provider Note   CSN: [id number] Arrival date & time: 02/11/22  1048     History  Chief Complaint  Patient presents with   Laceration    Latoya Espinoza is a 46 y.o. female.  HPI  Patient with medical history including diabetes type 2 presents with complaints of right-sided hand pain.  Patient's today while she was trying to open up a window it broke and it slammed on her right hand, hit her third and fourth digits, states she has pain mainly at the DIP joints, she has a laceration on her fourth digit, states hemodynamically stable, she states pain is constant, worse with movement improved with rest, denies paresthesia or weakness in her right hand..  She does not remember the last time she had a tetanus shot, has no other complaints at this time.  Home Medications Prior to Admission medications   Medication Sig Start Date End Date Taking? Authorizing Provider  oxyCODONE-acetaminophen (PERCOCET/ROXICET) 5-325 MG tablet Take 1 tablet by mouth every 8 (eight) hours as needed for up to 3 days for severe pain. 02/11/22 02/14/22 Yes Marcello Fennel, PA-C  Cholecalciferol (VITAMIN D3) 2000 units TABS Take 2,000 Units by mouth daily.    [provider]  escitalopram (LEXAPRO) 5 MG tablet Take by mouth. 01/19/21   [provider]  meloxicam (MOBIC) 15 MG tablet Take 1 tablet (15 mg total) by mouth daily. 02/17/21   Hyatt, Max T, DPM  metFORMIN (GLUCOPHAGE) 500 MG tablet Take 500 mg by mouth daily. 02/13/21   [provider]  tiZANidine (ZANAFLEX) 4 MG tablet Take 4 mg by mouth 4 (four) times daily as needed. 02/09/21   [provider]  Vitamin D, Ergocalciferol, (DRISDOL) 1.25 MG (50000 UNIT) CAPS capsule Take 50,000 Units by mouth once a week. 02/10/21   [provider]      Allergies    Dilaudid [hydromorphone hcl]    Review of Systems   Review of Systems  Constitutional:  Negative for chills and fever.   Respiratory:  Negative for shortness of breath.   Cardiovascular:  Negative for chest pain.  Gastrointestinal:  Negative for abdominal pain.  Musculoskeletal:        Pain along her right third and fourth digit along the DIP joint   Skin:  Positive for wound.  Neurological:  Negative for headaches.   Physical Exam Updated Vital Signs BP 121/71 (BP Location: Left Arm)    Pulse 72    Temp 98.1 F (36.7 C) (Oral)    Resp 16    Ht 5\' 5"  (1.651 m)    Wt 97.1 kg    LMP 05/31/2018 (Exact Date)    SpO2 100%    BMI 35.61 kg/m  Physical Exam Vitals and nursing note reviewed.  Constitutional:      General: She is not in acute distress.    Appearance: Normal appearance. She is not ill-appearing or diaphoretic.  HENT:     Head: Normocephalic and atraumatic.     Nose: No congestion or rhinorrhea.  Eyes:     Conjunctiva/sclera: Conjunctivae normal.  Cardiovascular:     Rate and Rhythm: Normal rate and regular rhythm.  Pulmonary:     Effort: Pulmonary effort is normal.     Breath sounds: Normal breath sounds.  Musculoskeletal:     Cervical back: Neck supple.     Right lower leg: No edema.     Left lower leg: No edema.  Comments: Patient's right hand was visualized there is a superficial laceration noted on the fourth digit which runs parallel along the radial aspect.  It is hemodynamically stable, does not gape with movement, no nailbed involvement.  Patient is notably tender along the DIP joints of the third and fourth digits, she is able to move in all joints of her fingers but has limited range of motion noted mainly in the DIP joint of the fourth digit.  Neurovascular fully intact.  Skin:    General: Skin is warm and dry.     Coloration: Skin is not jaundiced or pale.  Neurological:     Mental Status: She is alert and oriented to person, place, and time.  Psychiatric:        Mood and Affect: Mood normal.    ED Results / Procedures / Treatments   Labs (all labs ordered are listed,  but only abnormal results are displayed) Labs Reviewed - No data to display  EKG None  Radiology DG Hand Complete Right  Result Date: 02/11/2022 CLINICAL DATA:  Status post injury. Patient was raising bathroom window wind inj broke causing the window 2 slam on her right fingers in right hand. EXAM: RIGHT HAND - COMPLETE 3+ VIEW COMPARISON:  None. FINDINGS: There is no evidence of fracture or dislocation. There is no evidence of arthropathy or other focal bone abnormality. Soft tissues are unremarkable. IMPRESSION: Negative. Electronically Signed   By: Kerby Moors M.D.   On: 02/11/2022 11:46    Procedures Procedures    Medications Ordered in ED Medications  Tdap (BOOSTRIX) injection 0.5 mL (0.5 mLs Intramuscular Given 02/11/22 1126)  HYDROcodone-acetaminophen (NORCO/VICODIN) 5-325 MG per tablet 1 tablet (1 tablet Oral Given 02/11/22 1125)    ED Course/ Medical Decision Making/ A&P                           Medical Decision Making Amount and/or Complexity of Data Reviewed Radiology: ordered.  Risk Prescription drug management.   This patient presents to the ED for concern of trauma to the right hand, this involves an extensive number of treatment options, and is a complaint that carries with it a high risk of complications and morbidity.  The differential diagnosis includes fractures, dislocations, tendon damage    Additional history obtained:  Additional history obtained from N/A    Co morbidities that complicate the patient evaluation  Diabetes  Social Determinants of Health:  N/A    Lab Tests:  I Ordered, and personally interpreted labs.  The pertinent results include: N/A   Imaging Studies ordered:  I ordered imaging studies including DG of right hand I independently visualized and interpreted imaging which showed negative for acute findings I agree with the radiologist interpretation    Reevaluation: Presents with trauma to the right hand, patient  was not up-to-date on her tetanus shot, she was updated on this as well as provided with oxycodone for pain.  Patient's wound was thoroughly rinsed out, reassessed wound is superficial nature does not gape does not require suturing at this time.   Rule out Low suspicion for fracture or dislocation as x-ray does not feel any significant findings. low suspicion for tendon damage as area was palpated no gross defects noted, or deep tissue laceration.  He does have some slight limited range of motion at her DIP joint I suspect this more secondary due to pain.  Low suspicion for compartment syndrome as area was palpated it  was soft to the touch, neurovascular fully intact.     Dispostion and problem list  After consideration of the diagnostic results and the patients response to treatment, I feel that the patent would benefit from discharge.  Hand trauma-likely pain is muscular in nature, will recommend symptom management, will have her follow-up with orthopedic surgery if symptoms are not improving within next couple days time.  Gave strict return precautions.            Final Clinical Impression(s) / ED Diagnoses Final diagnoses:  Laceration of right ring finger with foreign body without damage to nail, initial encounter  Injury of right hand, initial encounter    Rx / DC Orders ED Discharge Orders          Ordered    oxyCODONE-acetaminophen (PERCOCET/ROXICET) 5-325 MG tablet  Every 8 hours PRN        02/11/22 1208              Marcello Fennel, PA-C 02/11/22 1209    Wynona Dove A, DO 02/11/22 1614

## 2022-02-11 NOTE — Discharge Instructions (Addendum)
Imaging and exam are reassuring, likely you bruised the area which is causing your pain.  The small laceration on your finger  should heal on its own, please  keep the area clean may put antiseptic ointment over it like Neosporin as needed.  Given short course of pain medications please take as prescribed.  I have given you a short course of narcotics please take as prescribed.  This medication can make you drowsy do not consume alcohol or operate heavy machinery when taking this medication.  This medication is Tylenol in it do not take Tylenol and take this medication.   If you notice that your pain is remained the same and unable to move the ring finger after a few days time I would like to follow-up with orthopedics for reevaluation.  Come back to the emergency department if you develop chest pain, shortness of breath, severe abdominal pain, uncontrolled nausea, vomiting, diarrhea.

## 2022-02-11 NOTE — ED Triage Notes (Signed)
Pt was raising the bathroom window and hinge broke, causing the window slam on her right fingers and hand.  Unable to move ring finger. Last tetanus shot unknown.

## 2022-11-09 ENCOUNTER — Other Ambulatory Visit (HOSPITAL_COMMUNITY): Payer: Self-pay | Admitting: Adult Health Nurse Practitioner

## 2022-11-09 DIAGNOSIS — Z1231 Encounter for screening mammogram for malignant neoplasm of breast: Secondary | ICD-10-CM

## 2022-11-13 ENCOUNTER — Ambulatory Visit (HOSPITAL_COMMUNITY)
Admission: RE | Admit: 2022-11-13 | Discharge: 2022-11-13 | Disposition: A | Discharge status: Home / Self Care | Payer: 59 | Source: Ambulatory Visit | Attending: Adult Health Nurse Practitioner | Admitting: Adult Health Nurse Practitioner

## 2022-11-13 DIAGNOSIS — Z1231 Encounter for screening mammogram for malignant neoplasm of breast: Secondary | ICD-10-CM | POA: Insufficient documentation

## 2023-04-03 ENCOUNTER — Other Ambulatory Visit (HOSPITAL_COMMUNITY): Payer: Self-pay

## 2023-04-03 MED ORDER — MOUNJARO 2.5 MG/0.5ML ~~LOC~~ SOAJ
2.5000 mg | SUBCUTANEOUS | 0 refills | Status: DC
  Filled 2023-04-03: qty 2, 28d supply, fill #0

## 2023-05-04 ENCOUNTER — Other Ambulatory Visit (HOSPITAL_COMMUNITY): Payer: Self-pay

## 2023-05-04 MED ORDER — MOUNJARO 2.5 MG/0.5ML ~~LOC~~ SOAJ
2.5000 mg | SUBCUTANEOUS | 3 refills | Status: AC
  Filled 2023-05-04: qty 2, 28d supply, fill #0
  Filled 2023-05-31: qty 2, 28d supply, fill #1

## 2023-05-07 ENCOUNTER — Other Ambulatory Visit (HOSPITAL_COMMUNITY): Payer: Self-pay

## 2023-05-31 ENCOUNTER — Other Ambulatory Visit (HOSPITAL_COMMUNITY): Payer: Self-pay

## 2023-05-31 MED ORDER — MOUNJARO 5 MG/0.5ML ~~LOC~~ SOAJ
5.0000 mg | SUBCUTANEOUS | 2 refills | Status: DC
  Filled 2023-05-31: qty 2, 28d supply, fill #0
  Filled 2023-06-26: qty 2, 28d supply, fill #1
  Filled 2023-07-25: qty 2, 28d supply, fill #2

## 2023-06-01 ENCOUNTER — Other Ambulatory Visit (HOSPITAL_COMMUNITY): Payer: Self-pay

## 2023-09-04 ENCOUNTER — Other Ambulatory Visit (HOSPITAL_COMMUNITY): Payer: Self-pay

## 2023-09-04 MED ORDER — MOUNJARO 5 MG/0.5ML ~~LOC~~ SOAJ
5.0000 mg | SUBCUTANEOUS | 2 refills | Status: AC
  Filled 2023-09-04: qty 2, 28d supply, fill #0
  Filled 2023-09-30: qty 2, 28d supply, fill #1

## 2023-10-25 ENCOUNTER — Other Ambulatory Visit (HOSPITAL_COMMUNITY): Payer: Self-pay

## 2023-10-25 MED ORDER — TIRZEPATIDE 5 MG/0.5ML ~~LOC~~ SOAJ
5.0000 mg | SUBCUTANEOUS | 2 refills | Status: DC
  Filled 2023-10-25: qty 2, 28d supply, fill #0
  Filled 2023-11-30: qty 2, 28d supply, fill #1
  Filled 2023-12-25: qty 2, 28d supply, fill #2

## 2023-12-27 ENCOUNTER — Other Ambulatory Visit (HOSPITAL_COMMUNITY): Payer: Self-pay | Admitting: Adult Health Nurse Practitioner

## 2023-12-27 DIAGNOSIS — Z1231 Encounter for screening mammogram for malignant neoplasm of breast: Secondary | ICD-10-CM

## 2024-01-02 ENCOUNTER — Ambulatory Visit (HOSPITAL_COMMUNITY)
Admission: RE | Admit: 2024-01-02 | Discharge: 2024-01-02 | Disposition: A | Discharge status: Home / Self Care | Payer: BC Managed Care – PPO | Source: Ambulatory Visit | Attending: Adult Health Nurse Practitioner | Admitting: Adult Health Nurse Practitioner

## 2024-01-02 DIAGNOSIS — Z1231 Encounter for screening mammogram for malignant neoplasm of breast: Secondary | ICD-10-CM | POA: Diagnosis present

## 2024-01-18 ENCOUNTER — Other Ambulatory Visit (HOSPITAL_COMMUNITY): Payer: Self-pay

## 2024-01-18 MED ORDER — MOUNJARO 5 MG/0.5ML ~~LOC~~ SOAJ
5.0000 mg | SUBCUTANEOUS | 2 refills | Status: AC
  Filled 2024-01-18: qty 2, 28d supply, fill #0

## 2024-02-20 ENCOUNTER — Other Ambulatory Visit (HOSPITAL_COMMUNITY): Payer: Self-pay

## 2024-02-20 MED ORDER — MOUNJARO 7.5 MG/0.5ML ~~LOC~~ SOAJ
7.5000 mg | SUBCUTANEOUS | 2 refills | Status: DC
  Filled 2024-02-20 – 2024-02-21 (×4): qty 2, 28d supply, fill #0
  Filled 2024-03-18: qty 2, 28d supply, fill #1
  Filled 2024-04-15: qty 2, 28d supply, fill #2

## 2024-02-21 ENCOUNTER — Other Ambulatory Visit (HOSPITAL_COMMUNITY): Payer: Self-pay

## 2024-02-21 ENCOUNTER — Other Ambulatory Visit: Payer: Self-pay

## 2024-05-08 ENCOUNTER — Other Ambulatory Visit (HOSPITAL_COMMUNITY): Payer: Self-pay

## 2024-05-09 ENCOUNTER — Other Ambulatory Visit (HOSPITAL_COMMUNITY): Payer: Self-pay

## 2024-05-09 MED ORDER — MOUNJARO 7.5 MG/0.5ML ~~LOC~~ SOAJ
7.5000 mg | SUBCUTANEOUS | 2 refills | Status: AC
  Filled 2024-05-09: qty 2, 28d supply, fill #0
  Filled 2024-06-07: qty 2, 28d supply, fill #1
  Filled 2024-07-31: qty 2, 28d supply, fill #2

## 2024-07-07 ENCOUNTER — Other Ambulatory Visit (HOSPITAL_COMMUNITY): Payer: Self-pay

## 2024-07-07 MED ORDER — MOUNJARO 7.5 MG/0.5ML ~~LOC~~ SOAJ
7.5000 mg | SUBCUTANEOUS | 2 refills | Status: DC
  Filled 2024-07-07: qty 2, 28d supply, fill #0
  Filled 2024-08-28: qty 2, 28d supply, fill #1
  Filled 2024-09-30: qty 2, 28d supply, fill #2

## 2024-08-05 ENCOUNTER — Other Ambulatory Visit (HOSPITAL_COMMUNITY): Payer: Self-pay

## 2024-10-29 ENCOUNTER — Other Ambulatory Visit (HOSPITAL_COMMUNITY): Payer: Self-pay

## 2024-10-29 MED ORDER — MOUNJARO 10 MG/0.5ML ~~LOC~~ SOAJ
10.0000 mg | SUBCUTANEOUS | 2 refills | Status: DC
  Filled 2024-10-29: qty 2, 28d supply, fill #0
  Filled 2024-11-26: qty 2, 28d supply, fill #1
  Filled 2024-12-24: qty 2, 28d supply, fill #2

## 2025-01-06 ENCOUNTER — Other Ambulatory Visit (HOSPITAL_COMMUNITY): Payer: Self-pay | Admitting: Adult Health Nurse Practitioner

## 2025-01-06 DIAGNOSIS — Z1231 Encounter for screening mammogram for malignant neoplasm of breast: Secondary | ICD-10-CM

## 2025-01-08 ENCOUNTER — Ambulatory Visit (HOSPITAL_COMMUNITY)
Admission: RE | Admit: 2025-01-08 | Discharge: 2025-01-08 | Disposition: A | Discharge status: Home / Self Care | Source: Ambulatory Visit | Attending: Adult Health Nurse Practitioner | Admitting: Adult Health Nurse Practitioner

## 2025-01-08 ENCOUNTER — Encounter (HOSPITAL_COMMUNITY): Payer: Self-pay

## 2025-01-08 DIAGNOSIS — Z1231 Encounter for screening mammogram for malignant neoplasm of breast: Secondary | ICD-10-CM | POA: Insufficient documentation

## 2025-01-20 ENCOUNTER — Other Ambulatory Visit (HOSPITAL_COMMUNITY): Payer: Self-pay

## 2025-01-21 ENCOUNTER — Other Ambulatory Visit (HOSPITAL_COMMUNITY): Payer: Self-pay

## 2025-01-21 MED ORDER — MOUNJARO 10 MG/0.5ML ~~LOC~~ SOAJ
10.0000 mg | SUBCUTANEOUS | 2 refills | Status: AC
  Filled 2025-01-21: qty 2, 28d supply, fill #0
